# Patient Record
Sex: Male | Born: 1950 | Race: Black or African American | Hispanic: No | Marital: Married | Smoking: Former smoker
Health system: Southern US, Community
[De-identification: ages and names within clinical notes are randomized; demographics above are authoritative.]

## PROBLEM LIST (undated history)

## (undated) DIAGNOSIS — I10 Essential (primary) hypertension: Secondary | ICD-10-CM

## (undated) DIAGNOSIS — R0602 Shortness of breath: Secondary | ICD-10-CM

## (undated) DIAGNOSIS — E7800 Pure hypercholesterolemia, unspecified: Secondary | ICD-10-CM

## (undated) DIAGNOSIS — E119 Type 2 diabetes mellitus without complications: Secondary | ICD-10-CM

## (undated) HISTORY — PX: BACK SURGERY: SHX140

## (undated) HISTORY — PX: THYROIDECTOMY: SHX17

---

## 2002-02-24 ENCOUNTER — Ambulatory Visit (HOSPITAL_COMMUNITY): Admission: RE | Admit: 2002-02-24 | Discharge: 2002-02-24 | Payer: Self-pay | Admitting: Family Medicine

## 2002-02-24 ENCOUNTER — Encounter: Payer: Self-pay | Admitting: Family Medicine

## 2004-04-04 ENCOUNTER — Ambulatory Visit (HOSPITAL_COMMUNITY): Admission: RE | Admit: 2004-04-04 | Discharge: 2004-04-04 | Payer: Self-pay | Admitting: Family Medicine

## 2006-04-09 ENCOUNTER — Ambulatory Visit (HOSPITAL_COMMUNITY): Admission: RE | Admit: 2006-04-09 | Discharge: 2006-04-09 | Payer: Self-pay | Admitting: Gastroenterology

## 2006-04-09 ENCOUNTER — Ambulatory Visit: Payer: Self-pay | Admitting: Gastroenterology

## 2006-04-09 ENCOUNTER — Encounter (INDEPENDENT_AMBULATORY_CARE_PROVIDER_SITE_OTHER): Payer: Self-pay | Admitting: *Deleted

## 2009-06-09 ENCOUNTER — Ambulatory Visit (HOSPITAL_COMMUNITY): Admission: RE | Admit: 2009-06-09 | Discharge: 2009-06-09 | Payer: Self-pay | Admitting: Family Medicine

## 2009-07-07 ENCOUNTER — Emergency Department (HOSPITAL_COMMUNITY): Admission: EM | Admit: 2009-07-07 | Discharge: 2009-07-07 | Payer: Self-pay | Admitting: Emergency Medicine

## 2009-09-13 ENCOUNTER — Ambulatory Visit (HOSPITAL_COMMUNITY): Admission: RE | Admit: 2009-09-13 | Discharge: 2009-09-13 | Payer: Self-pay | Admitting: Family Medicine

## 2010-04-21 ENCOUNTER — Ambulatory Visit (HOSPITAL_COMMUNITY): Admission: RE | Admit: 2010-04-21 | Discharge: 2010-04-21 | Payer: Self-pay | Admitting: Family Medicine

## 2010-05-30 ENCOUNTER — Other Ambulatory Visit: Admission: RE | Admit: 2010-05-30 | Discharge: 2010-05-30 | Payer: Self-pay | Admitting: Urology

## 2010-09-12 ENCOUNTER — Ambulatory Visit (HOSPITAL_COMMUNITY)
Admission: RE | Admit: 2010-09-12 | Discharge: 2010-09-12 | Payer: Self-pay | Source: Home / Self Care | Attending: Family Medicine | Admitting: Family Medicine

## 2010-10-03 ENCOUNTER — Other Ambulatory Visit: Payer: Self-pay | Admitting: Urology

## 2010-10-03 ENCOUNTER — Encounter (HOSPITAL_COMMUNITY): Payer: 59

## 2010-10-03 DIAGNOSIS — Z01810 Encounter for preprocedural cardiovascular examination: Secondary | ICD-10-CM | POA: Insufficient documentation

## 2010-10-03 DIAGNOSIS — Z01812 Encounter for preprocedural laboratory examination: Secondary | ICD-10-CM | POA: Insufficient documentation

## 2010-10-03 LAB — BASIC METABOLIC PANEL
BUN: 13 mg/dL (ref 6–23)
CO2: 26 mEq/L (ref 19–32)
Calcium: 9.5 mg/dL (ref 8.4–10.5)
Creatinine, Ser: 1.1 mg/dL (ref 0.4–1.5)
GFR calc Af Amer: >60 mL/min (ref >60)

## 2010-10-03 LAB — SURGICAL PCR SCREEN
MRSA, PCR: NEGATIVE
Staphylococcus aureus: NEGATIVE

## 2010-10-03 LAB — HEMOGLOBIN AND HEMATOCRIT, BLOOD: HCT: 41.7 % (ref 39.0–52.0)

## 2010-10-04 ENCOUNTER — Ambulatory Visit (HOSPITAL_COMMUNITY)
Admission: RE | Admit: 2010-10-04 | Discharge: 2010-10-04 | Disposition: A | Discharge status: Home / Self Care | Payer: 59 | Source: Ambulatory Visit | Attending: Urology | Admitting: Urology

## 2010-10-04 DIAGNOSIS — Z01812 Encounter for preprocedural laboratory examination: Secondary | ICD-10-CM | POA: Insufficient documentation

## 2010-10-04 DIAGNOSIS — R972 Elevated prostate specific antigen [PSA]: Secondary | ICD-10-CM | POA: Insufficient documentation

## 2010-10-04 DIAGNOSIS — Z538 Procedure and treatment not carried out for other reasons: Secondary | ICD-10-CM | POA: Insufficient documentation

## 2010-10-04 DIAGNOSIS — Z01810 Encounter for preprocedural cardiovascular examination: Secondary | ICD-10-CM | POA: Insufficient documentation

## 2010-10-06 ENCOUNTER — Ambulatory Visit (HOSPITAL_COMMUNITY)
Admission: RE | Admit: 2010-10-06 | Discharge: 2010-10-06 | Disposition: A | Discharge status: Home / Self Care | Payer: 59 | Source: Ambulatory Visit | Attending: Urology | Admitting: Urology

## 2010-10-06 ENCOUNTER — Other Ambulatory Visit: Payer: Self-pay | Admitting: Urology

## 2010-10-06 DIAGNOSIS — Z79899 Other long term (current) drug therapy: Secondary | ICD-10-CM | POA: Insufficient documentation

## 2010-10-06 DIAGNOSIS — I10 Essential (primary) hypertension: Secondary | ICD-10-CM | POA: Insufficient documentation

## 2010-10-06 DIAGNOSIS — R972 Elevated prostate specific antigen [PSA]: Secondary | ICD-10-CM | POA: Insufficient documentation

## 2010-10-06 DIAGNOSIS — E785 Hyperlipidemia, unspecified: Secondary | ICD-10-CM | POA: Insufficient documentation

## 2010-10-06 DIAGNOSIS — E119 Type 2 diabetes mellitus without complications: Secondary | ICD-10-CM | POA: Insufficient documentation

## 2010-10-12 NOTE — Op Note (Signed)
  NAMEJUVENTINO, PAVONE               ACCOUNT NO.:  [id number]  MEDICAL RECORD NO.:  [id number]           PATIENT TYPE:  O  LOCATION:  DAYP                          FACILITY:  [facility]  PHYSICIAN:  Ky Barban, M.D.DATE OF BIRTH:  [birth year]  DATE OF PROCEDURE: DATE OF DISCHARGE:  10/06/2010                              OPERATIVE REPORT   PREOPERATIVE DIAGNOSIS:  Elevated PSA.  POSTOPERATIVE DIAGNOSIS:  Elevated PSA.  PROCEDURE:  Transrectal needle biopsy of the prostate.  ANESTHESIA:  IV MAC.  PROCEDURE:  The patient under IV sedation in lithotomy position as usual prep and drape.  After usual prep, digital rectal exam was done. Prostate was palpated, feels normal.  Then using a Tru-Cut biopsy needle several biopsies were done from both sides of the prostate.  No complications and the patient left the procedure room in satisfactory condition.     Ky Barban, M.D.     MIJ/MEDQ  D:  10/06/2010  T:  10/07/2010  Job:  [id number]  Electronically Signed by Alleen Borne M.D. on 10/12/2010 11:56:45 AM

## 2011-01-06 NOTE — Op Note (Signed)
Chris Wade, Chris Wade               ACCOUNT NO.:  [id number]   MEDICAL RECORD NO.:  [id number]          PATIENT TYPE:  AMB   LOCATION:  DAY                           FACILITY:  [facility]   PHYSICIAN:  Kassie Mends, M.D.      DATE OF BIRTH:  1950/09/13   DATE OF PROCEDURE:  04/09/2006  DATE OF DISCHARGE:                                 OPERATIVE REPORT   PROCEDURE:  Colonoscopy with cold forceps polypectomy and biopsy.   INDICATIONS FOR EXAMINATION:  Mr. Monrreal is a 60 year old male whose mother  had colon cancer in her 59s.  His last colonoscopy was 2000.   MEDICATIONS:  1. Demerol 75 mg IV.  2. Versed 3 mg IV.   FINDINGS:  1. Cecal prominence with pillow-like consistency and yellow hue.  Tunnel      biopsies obtained via cold forceps.  A proximal ascending colon fold      which was prominent had pillow-like consistency.  Tunnel biopsies      obtained via cold forceps.  2. A 4-mm ascending colon polyp removed via cold forceps.  3. Otherwise, no diverticula, masses, inflammatory changes or vascular      ectasia seen.  4. Normal retroflexed view of the rectum.   FINAL RECOMMENDATIONS:  1. No aspirin, NSAIDs or anticoagulation for 7 days.  2. Will follow up biopsies.  3. Colonoscopy in 5 years.  If polyps are adenomatous, his children should      begin colon cancer screening at age 79.  4. Follow with Dr. Renard Matter.   PROCEDURE TECHNIQUE:  Physical exam was performed, and informed consent was  obtained from the patient after explaining all the benefits, risks and  alternatives to the procedure.  The patient was connected to the monitor and  placed in left lateral position.  Continuous oxygen was provided by nasal  cannula and IV medicine administered through an indwelling cannula.  After  administration of sedation and rectal exam, the scope was advanced under  direct visualization to the cecum.  The scope was subsequently removed  slowly by carefully examining the color, texture,  anatomy and integrity of  mucosa on the way out.  The patient was recovered in the endoscopy suite and  discharged to the floor in satisfactory condition.      Kassie Mends, M.D.  Electronically Signed     SM/MEDQ  D:  04/09/2006  T:  04/09/2006  Job:  [id number]   cc:   Angus G. Renard Matter, MD  Fax: [phone]

## 2011-03-23 ENCOUNTER — Encounter: Payer: Self-pay | Admitting: Gastroenterology

## 2012-09-30 ENCOUNTER — Other Ambulatory Visit (HOSPITAL_COMMUNITY): Payer: Self-pay | Admitting: Family Medicine

## 2012-09-30 ENCOUNTER — Ambulatory Visit (HOSPITAL_COMMUNITY)
Admission: RE | Admit: 2012-09-30 | Discharge: 2012-09-30 | Disposition: A | Discharge status: Home / Self Care | Payer: 59 | Source: Ambulatory Visit | Attending: Family Medicine | Admitting: Family Medicine

## 2012-09-30 DIAGNOSIS — Z87891 Personal history of nicotine dependence: Secondary | ICD-10-CM

## 2012-09-30 DIAGNOSIS — Z0000 Encounter for general adult medical examination without abnormal findings: Secondary | ICD-10-CM | POA: Insufficient documentation

## 2013-12-15 ENCOUNTER — Ambulatory Visit (HOSPITAL_COMMUNITY)
Admission: RE | Admit: 2013-12-15 | Discharge: 2013-12-15 | Disposition: A | Discharge status: Home / Self Care | Payer: 59 | Source: Ambulatory Visit | Attending: Family Medicine | Admitting: Family Medicine

## 2013-12-15 ENCOUNTER — Other Ambulatory Visit (HOSPITAL_COMMUNITY): Payer: Self-pay | Admitting: Family Medicine

## 2013-12-15 DIAGNOSIS — R05 Cough: Secondary | ICD-10-CM | POA: Insufficient documentation

## 2013-12-15 DIAGNOSIS — F17200 Nicotine dependence, unspecified, uncomplicated: Secondary | ICD-10-CM | POA: Insufficient documentation

## 2013-12-15 DIAGNOSIS — Z87891 Personal history of nicotine dependence: Secondary | ICD-10-CM

## 2013-12-15 DIAGNOSIS — R059 Cough, unspecified: Secondary | ICD-10-CM | POA: Insufficient documentation

## 2014-01-13 ENCOUNTER — Other Ambulatory Visit: Payer: Self-pay

## 2014-01-13 ENCOUNTER — Encounter (HOSPITAL_COMMUNITY)
Admission: RE | Admit: 2014-01-13 | Discharge: 2014-01-13 | Disposition: A | Discharge status: Home / Self Care | Payer: 59 | Source: Ambulatory Visit | Attending: Urology | Admitting: Urology

## 2014-01-13 ENCOUNTER — Encounter (HOSPITAL_COMMUNITY): Payer: Self-pay

## 2014-01-13 ENCOUNTER — Encounter (HOSPITAL_COMMUNITY): Payer: Self-pay | Admitting: Pharmacy Technician

## 2014-01-13 DIAGNOSIS — Z01810 Encounter for preprocedural cardiovascular examination: Secondary | ICD-10-CM | POA: Insufficient documentation

## 2014-01-13 DIAGNOSIS — Z01812 Encounter for preprocedural laboratory examination: Secondary | ICD-10-CM | POA: Insufficient documentation

## 2014-01-13 HISTORY — DX: Shortness of breath: R06.02

## 2014-01-13 HISTORY — DX: Type 2 diabetes mellitus without complications: E11.9

## 2014-01-13 HISTORY — DX: Pure hypercholesterolemia, unspecified: E78.00

## 2014-01-13 HISTORY — DX: Essential (primary) hypertension: I10

## 2014-01-13 LAB — BASIC METABOLIC PANEL
BUN: 16 mg/dL (ref 6–23)
CALCIUM: 9.8 mg/dL (ref 8.4–10.5)
CO2: 26 meq/L (ref 19–32)
Chloride: 102 mEq/L (ref 96–112)
Creatinine, Ser: 0.97 mg/dL (ref 0.50–1.35)
GFR calc Af Amer: >90 mL/min (ref >90)
GFR, EST NON AFRICAN AMERICAN: 88 mL/min — AB (ref >90)
Glucose, Bld: 213 mg/dL — ABNORMAL HIGH (ref 70–99)
POTASSIUM: 4.2 meq/L (ref 3.7–5.3)
SODIUM: 142 meq/L (ref 137–147)

## 2014-01-13 LAB — HEMOGLOBIN AND HEMATOCRIT, BLOOD
HEMATOCRIT: 42.1 % (ref 39.0–52.0)
HEMOGLOBIN: 13.9 g/dL (ref 13.0–17.0)

## 2014-01-13 NOTE — Progress Notes (Signed)
01/13/14 1356  OBSTRUCTIVE SLEEP APNEA  Have you ever been diagnosed with sleep apnea through a sleep study? No  Do you snore loudly (loud enough to be heard through closed doors)?  1  Do you often feel tired, fatigued, or sleepy during the daytime? 0  Has anyone observed you stop breathing during your sleep? 0  Do you have, or are you being treated for high blood pressure? 1  BMI more than 35 kg/m2? 0  Age over 61 years old? 1  Neck circumference greater than 40 cm/16 inches? 0  Gender: 1  Obstructive Sleep Apnea Score 4  Score 4 or greater  Results sent to PCP

## 2014-01-13 NOTE — Patient Instructions (Addendum)
Chris Wade  01/13/2014   Your procedure is scheduled on:   01/15/2014  Report to [facility] at  830 AM.  Call this number if you have problems the morning of surgery: [phone]   Remember:   Do not eat food or drink liquids after midnight.   Take these medicines the morning of surgery with A SIP OF WATER: blood pressure pill. No medicines for diabetes the morning of surgery.   Do not wear jewelry, make-up or nail polish.  Do not wear lotions, powders, or perfumes.   Do not shave 48 hours prior to surgery. Men may shave face and neck.  Do not bring valuables to the hospital.  [facility] is not responsible for any belongings or valuables.               Contacts, dentures or bridgework may not be worn into surgery.  Leave suitcase in the car. After surgery it may be brought to your room.  For patients admitted to the hospital, discharge time is determined by your  treatment team.               Patients discharged the day of surgery will not be allowed to drive home.  Name and phone number of your driver: family  Special Instructions: Shower using CHG 2 nights before surgery and the night before surgery.  If you shower the day of surgery use CHG.  Use special wash - you have one bottle of CHG for all showers.  You should use approximately 1/3 of the bottle for each shower.   Please read over the following fact sheets that you were given: Pain Booklet, Coughing and Deep Breathing, Surgical Site Infection Prevention, Anesthesia Post-op Instructions and Care and Recovery After Surgery Biopsy Care After Refer to this sheet in the next few weeks. These instructions provide you with information on caring for yourself after your procedure. Your caregiver may also give you more specific instructions. Your treatment has been planned according to current medical practices, but problems sometimes occur. Call your caregiver if you have any problems or questions after your procedure. If  you had a fine needle biopsy, you may have soreness at the biopsy site for 1 to 2 days. If you had an open biopsy, you may have soreness at the biopsy site for 3 to 4 days. HOME CARE INSTRUCTIONS   You may resume normal diet and activities as directed.  Change bandages (dressings) as directed. If your wound was closed with a skin glue (adhesive), it will wear off and begin to peel in 7 days.  Only take over-the-counter or prescription medicines for pain, discomfort, or fever as directed by your caregiver.  Ask your caregiver when you can bathe and get your wound wet. SEEK IMMEDIATE MEDICAL CARE IF:   You have increased bleeding (more than a small spot) from the biopsy site.  You notice redness, swelling, or increasing pain at the biopsy site.  You have pus coming from the biopsy site.  You have a fever.  You notice a bad smell coming from the biopsy site or dressing.  You have a rash, have difficulty breathing, or have any allergic problems. MAKE SURE YOU:   Understand these instructions.  Will watch your condition.  Will get help right away if you are not doing well or get worse. Document Released: 02/24/2005 Document Revised: 10/30/2011 Document Reviewed: 02/02/2011 [facility] Patient Information 2014 [employer] [place]. PATIENT INSTRUCTIONS POST-ANESTHESIA  IMMEDIATELY FOLLOWING  SURGERY:  Do not drive or operate machinery for the first twenty four hours after surgery.  Do not make any important decisions for twenty four hours after surgery or while taking narcotic pain medications or sedatives.  If you develop intractable nausea and vomiting or a severe headache please notify your doctor immediately.  FOLLOW-UP:  Please make an appointment with your surgeon as instructed. You do not need to follow up with anesthesia unless specifically instructed to do so.  WOUND CARE INSTRUCTIONS (if applicable):  Keep a dry clean dressing on the anesthesia/puncture wound site if there is  drainage.  Once the wound has quit draining you may leave it open to air.  Generally you should leave the bandage intact for twenty four hours unless there is drainage.  If the epidural site drains for more than 36-48 hours please call the anesthesia department.  QUESTIONS?:  Please feel free to call your physician or the hospital operator if you have any questions, and they will be happy to assist you.

## 2014-01-13 NOTE — Pre-Procedure Instructions (Signed)
Patient given information to sign up for my chart at home. 

## 2014-01-13 NOTE — Pre-Procedure Instructions (Signed)
Patient in for PAT and was instructed by office to stop ASA last Friday and patient "forgot". Has taken ASA througt this am. Spoke with Dr Lawerance Cruel office and they are going to reschedule procedure since ASA was not stopped. Preop done today. Instructed patient and wife we will call when rescheduled with time and both verbalized understanding. Both verbalized understanding of patient stopping ASA today.

## 2014-01-15 ENCOUNTER — Encounter (HOSPITAL_COMMUNITY): Admission: RE | Admit: 2014-01-15 | Payer: 59 | Source: Ambulatory Visit

## 2014-01-15 NOTE — H&P (Signed)
Urology History and Physical Exam  CC: Elevated PSA  HPI: 63 y.o. year old male who has elevated PSA, no urinary symptoms. He had one biopsy done in 2013 which was negative. At that Time, his PSA was 6.4. Now his PSA has gone up to 18.33. That is why I am recommending that we go ahead and do  Another biopsy. His prostate feels normal on digital rectal exam.  PMH: Past Medical History  Diagnosis Date  . Hypertension   . Hypercholesteremia   . Diabetes mellitus without complication   . Shortness of breath     PSH: Past Surgical History  Procedure Laterality Date  . Thyroidectomy    . Back surgery      ruptured disc    Allergies: No Known Allergies  Medications: No prescriptions prior to admission     Social History: History   Social History  . Marital Status: Married    Spouse Name: N/A    Number of Children: N/A  . Years of Education: N/A   Occupational History  . Not on file.   Social History Main Topics  . Smoking status: Former Smoker -- 1.00 packs/day for 30 years    Types: Cigarettes    Quit date: 01/14/2004  . Smokeless tobacco: Not on file  . Alcohol Use: No  . Drug Use: No  . Sexual Activity: Yes    Birth Control/ Protection: None   Other Topics Concern  . Not on file   Social History Narrative  . No narrative on file    Family History: No family history on file.  Review of Systems: Positive:  Negative: .  A further 10 point review of systems was negative except what is listed in  the HPI.  Physical Exam: @VITALS2 @ General: No acute distress.  Awake. Head:  Normocephalic.  Atraumatic. ENT:  EOMI.  Mucous membranes moist Neck:  Supple.  No lymphadenopathy. CV:  S1 present. S2 present. Regular rate. Pulmonary: Equal effort bilaterally.  Clear to auscultation bilaterally. Abdomen: Soft, non distended, no tenderness. Skin:  Normal turgor.  No visible rash. Extremity: No gross deformity of bilateral upper extremities.  No gross  deformity of                             lower extremities. Neurologic: Alert. Appropriate mood.  Penis:  circumcised.  No lesions. Urethra: Orthotopic meatus. Scrotum: No lesions.  No ecchymosis.  No erythema. Testicles: Descended bilaterally.  No masses bilaterally. Epididymis: Palpable bilaterally. Nontender to palpation.  Studies:  Recent Labs     01/13/14  1400  HGB  13.9    Recent Labs     01/13/14  1400  NA  142  K  4.2  CL  102  CO2  26  BUN  16  CREATININE  0.97  CALCIUM  9.8  GFRNONAA  88*  GFRAA  >90     No results found for this basename: PT, INR, APTT,  in the last 72 hours   No components found with this basename: ABG,      Assessment:  Elevated PSA..One negative prostate biopsy in 2013. Further elevation of PSA.    Plan:  Prostate biopsy under anesthesia as requested by the patient.   Ky Barban  01/15/2014 1:37 PM

## 2014-01-20 ENCOUNTER — Ambulatory Visit (HOSPITAL_COMMUNITY): Payer: 59 | Admitting: Anesthesiology

## 2014-01-20 ENCOUNTER — Encounter (HOSPITAL_COMMUNITY)
Admission: RE | Disposition: A | Discharge status: Home / Self Care | Payer: Self-pay | Source: Ambulatory Visit | Attending: Urology

## 2014-01-20 ENCOUNTER — Ambulatory Visit (HOSPITAL_COMMUNITY)
Admission: RE | Admit: 2014-01-20 | Discharge: 2014-01-20 | Disposition: A | Discharge status: Home / Self Care | Payer: 59 | Source: Ambulatory Visit | Attending: Urology | Admitting: Urology

## 2014-01-20 ENCOUNTER — Ambulatory Visit: Admit: 2014-01-20 | Payer: 59

## 2014-01-20 ENCOUNTER — Other Ambulatory Visit (HOSPITAL_COMMUNITY): Payer: Self-pay | Admitting: Urology

## 2014-01-20 ENCOUNTER — Ambulatory Visit (HOSPITAL_COMMUNITY): Payer: 59

## 2014-01-20 ENCOUNTER — Encounter (HOSPITAL_COMMUNITY): Payer: Self-pay | Admitting: Anesthesiology

## 2014-01-20 ENCOUNTER — Encounter (HOSPITAL_COMMUNITY): Payer: 59 | Admitting: Anesthesiology

## 2014-01-20 DIAGNOSIS — R972 Elevated prostate specific antigen [PSA]: Secondary | ICD-10-CM

## 2014-01-20 DIAGNOSIS — E119 Type 2 diabetes mellitus without complications: Secondary | ICD-10-CM | POA: Insufficient documentation

## 2014-01-20 DIAGNOSIS — I10 Essential (primary) hypertension: Secondary | ICD-10-CM | POA: Insufficient documentation

## 2014-01-20 DIAGNOSIS — Z87891 Personal history of nicotine dependence: Secondary | ICD-10-CM | POA: Insufficient documentation

## 2014-01-20 HISTORY — PX: PROSTATE BIOPSY: SHX241

## 2014-01-20 LAB — GLUCOSE, CAPILLARY
GLUCOSE-CAPILLARY: 172 mg/dL — AB (ref 70–99)
Glucose-Capillary: 157 mg/dL — ABNORMAL HIGH (ref 70–99)

## 2014-01-20 SURGERY — BIOPSY, PROSTATE
Anesthesia: Monitor Anesthesia Care | Site: Prostate

## 2014-01-20 MED ORDER — LACTATED RINGERS IV SOLN
INTRAVENOUS | Status: DC
  Administered 2014-01-20: 09:00:00 via INTRAVENOUS

## 2014-01-20 MED ORDER — SODIUM CHLORIDE BACTERIOSTATIC 0.9 % IJ SOLN
INTRAMUSCULAR | Status: AC
  Filled 2014-01-20: qty 10

## 2014-01-20 MED ORDER — FENTANYL CITRATE 0.05 MG/ML IJ SOLN: 25.0000 ug | INTRAMUSCULAR | Status: DC | PRN

## 2014-01-20 MED ORDER — FENTANYL CITRATE 0.05 MG/ML IJ SOLN
25.0000 ug | INTRAMUSCULAR | Status: AC
  Administered 2014-01-20 (×2): 25 ug via INTRAVENOUS

## 2014-01-20 MED ORDER — ONDANSETRON HCL 4 MG/2ML IJ SOLN
INTRAMUSCULAR | Status: AC
  Filled 2014-01-20: qty 2

## 2014-01-20 MED ORDER — LACTATED RINGERS IV SOLN
INTRAVENOUS | Status: DC | PRN
  Administered 2014-01-20: 08:00:00 via INTRAVENOUS

## 2014-01-20 MED ORDER — GLYCOPYRROLATE 0.2 MG/ML IJ SOLN
INTRAMUSCULAR | Status: AC
  Filled 2014-01-20: qty 1

## 2014-01-20 MED ORDER — SODIUM CHLORIDE BACTERIOSTATIC 0.9 % IJ SOLN
INTRAMUSCULAR | Status: AC
  Filled 2014-01-20: qty 30

## 2014-01-20 MED ORDER — MIDAZOLAM HCL 2 MG/2ML IJ SOLN
INTRAMUSCULAR | Status: AC
  Filled 2014-01-20: qty 2

## 2014-01-20 MED ORDER — MIDAZOLAM HCL 2 MG/2ML IJ SOLN
1.0000 mg | INTRAMUSCULAR | Status: DC | PRN
  Administered 2014-01-20: 2 mg via INTRAVENOUS

## 2014-01-20 MED ORDER — LIDOCAINE HCL (PF) 1 % IJ SOLN
INTRAMUSCULAR | Status: AC
  Filled 2014-01-20: qty 5

## 2014-01-20 MED ORDER — ONDANSETRON HCL 4 MG/2ML IJ SOLN
4.0000 mg | Freq: Once | INTRAMUSCULAR | Status: AC
  Administered 2014-01-20: 4 mg via INTRAVENOUS

## 2014-01-20 MED ORDER — PROPOFOL 10 MG/ML IV BOLUS
INTRAVENOUS | Status: AC
  Filled 2014-01-20: qty 20

## 2014-01-20 MED ORDER — FENTANYL CITRATE 0.05 MG/ML IJ SOLN
INTRAMUSCULAR | Status: DC | PRN
  Administered 2014-01-20 (×2): 50 ug via INTRAVENOUS

## 2014-01-20 MED ORDER — SURGILUBE EX GEL
CUTANEOUS | Status: DC | PRN
  Administered 2014-01-20: 1 via TOPICAL

## 2014-01-20 MED ORDER — FENTANYL CITRATE 0.05 MG/ML IJ SOLN
INTRAMUSCULAR | Status: AC
  Filled 2014-01-20: qty 2

## 2014-01-20 MED ORDER — FENTANYL CITRATE 0.05 MG/ML IJ SOLN
INTRAMUSCULAR | Status: AC
Start: 2014-01-20 — End: 2014-01-20
  Filled 2014-01-20: qty 5

## 2014-01-20 MED ORDER — EPHEDRINE SULFATE 50 MG/ML IJ SOLN
INTRAMUSCULAR | Status: AC
  Filled 2014-01-20: qty 1

## 2014-01-20 MED ORDER — ONDANSETRON HCL 4 MG/2ML IJ SOLN: 4.0000 mg | Freq: Once | INTRAMUSCULAR | Status: DC | PRN

## 2014-01-20 MED ORDER — PROPOFOL INFUSION 10 MG/ML OPTIME
INTRAVENOUS | Status: DC | PRN
  Administered 2014-01-20: 75 ug/kg/min via INTRAVENOUS
  Administered 2014-01-20: 10:00:00 via INTRAVENOUS

## 2014-01-20 MED ORDER — CIPROFLOXACIN IN D5W 400 MG/200ML IV SOLN
INTRAVENOUS | Status: AC
  Filled 2014-01-20: qty 200

## 2014-01-20 MED ORDER — GLYCOPYRROLATE 0.2 MG/ML IJ SOLN
0.2000 mg | Freq: Once | INTRAMUSCULAR | Status: AC
  Administered 2014-01-20: 0.2 mg via INTRAVENOUS

## 2014-01-20 MED ORDER — LIDOCAINE HCL (CARDIAC) 10 MG/ML IV SOLN
INTRAVENOUS | Status: DC | PRN
  Administered 2014-01-20: 50 mg via INTRAVENOUS

## 2014-01-20 MED ORDER — CIPROFLOXACIN IN D5W 400 MG/200ML IV SOLN
400.0000 mg | Freq: Once | INTRAVENOUS | Status: AC
  Administered 2014-01-20: 400 mg via INTRAVENOUS

## 2014-01-20 MED ORDER — PHENYLEPHRINE HCL 10 MG/ML IJ SOLN
INTRAMUSCULAR | Status: AC
  Filled 2014-01-20: qty 1

## 2014-01-20 MED ORDER — SUCCINYLCHOLINE CHLORIDE 20 MG/ML IJ SOLN
INTRAMUSCULAR | Status: AC
  Filled 2014-01-20: qty 1

## 2014-01-20 SURGICAL SUPPLY — 21 items
CLOTH BEACON ORANGE TIMEOUT ST (SAFETY) ×3 IMPLANT
COVER PROBE W GEL 5X96 (DRAPES) ×2 IMPLANT
COVER TABLE BACK 60X90 (DRAPES) ×3 IMPLANT
DRAPE PROXIMA HALF (DRAPES) ×3 IMPLANT
FORMALIN 10 PREFIL 20ML (MISCELLANEOUS) ×28 IMPLANT
GAUZE SPONGE 4X4 12PLY STRL (GAUZE/BANDAGES/DRESSINGS) ×3 IMPLANT
GLOVE BIO SURGEON STRL SZ7 (GLOVE) ×6 IMPLANT
GLOVE BIOGEL PI IND STRL 7.0 (GLOVE) IMPLANT
GLOVE BIOGEL PI INDICATOR 7.0 (GLOVE) ×4
GOWN STRL REUS W/TWL LRG LVL3 (GOWN DISPOSABLE) ×3 IMPLANT
KIT ROOM TURNOVER AP CYSTO (KITS) ×5 IMPLANT
LUBRICANT JELLY 4.5OZ STERILE (MISCELLANEOUS) ×3 IMPLANT
MANIFOLD NEPTUNE II (INSTRUMENTS) ×3 IMPLANT
MARKER SKIN DUAL TIP RULER LAB (MISCELLANEOUS) ×3 IMPLANT
NDL BIOPSY 18X20 MAGNUM (NEEDLE) ×1 IMPLANT
NEEDLE BIOPSY 18X20 MAGNUM (NEEDLE) ×3 IMPLANT
NS IRRIG 1000ML POUR BTL (IV SOLUTION) ×3 IMPLANT
PAD ARMBOARD 7.5X6 YLW CONV (MISCELLANEOUS) ×3 IMPLANT
PAD TELFA 3X4 1S STER (GAUZE/BANDAGES/DRESSINGS) ×5 IMPLANT
TOWEL OR 17X26 4PK STRL BLUE (TOWEL DISPOSABLE) ×3 IMPLANT
WATER STERILE IRR 1000ML POUR (IV SOLUTION) ×3 IMPLANT

## 2014-01-20 NOTE — Transfer of Care (Signed)
Immediate Anesthesia Transfer of Care Note  Patient: Chris Wade  Procedure(s) Performed: Procedure(s): PROSTATE BIOPSY (N/A)  Patient Location: PACU  Anesthesia Type:MAC  Level of Consciousness: awake, alert , oriented and patient cooperative  Airway & Oxygen Therapy: Patient Spontanous Breathing and Patient connected to nasal cannula oxygen  Post-op Assessment: Report given to PACU RN and Post -op Vital signs reviewed and stable  Post vital signs: Reviewed and stable  Complications: No apparent anesthesia complications

## 2014-01-20 NOTE — Anesthesia Preprocedure Evaluation (Signed)
Anesthesia Evaluation  Patient identified by MRN, date of birth, ID band Patient awake    Reviewed: Allergy & Precautions, H&P , NPO status , Patient's Chart, lab work & pertinent test results  Airway Mallampati: I TM Distance: >3 FB     Dental  (+) Poor Dentition, Loose, Missing   Pulmonary shortness of breath, former smoker (am cough),  breath sounds clear to auscultation        Cardiovascular hypertension, Pt. on medications Rhythm:Regular Rate:Normal     Neuro/Psych    GI/Hepatic   Endo/Other  diabetes, Type 2, Insulin Dependent, Oral Hypoglycemic Agents  Renal/GU      Musculoskeletal   Abdominal   Peds  Hematology   Anesthesia Other Findings   Reproductive/Obstetrics                           Anesthesia Physical Anesthesia Plan  ASA: III  Anesthesia Plan: MAC   Post-op Pain Management:    Induction: Intravenous  Airway Management Planned: Simple Face Mask  Additional Equipment:   Intra-op Plan:   Post-operative Plan:   Informed Consent: I have reviewed the patients History and Physical, chart, labs and discussed the procedure including the risks, benefits and alternatives for the proposed anesthesia with the patient or authorized representative who has indicated his/her understanding and acceptance.     Plan Discussed with:   Anesthesia Plan Comments:         Anesthesia Quick Evaluation

## 2014-01-20 NOTE — Brief Op Note (Signed)
01/20/2014  10:07 AM  PATIENT:  Chris Wade  63 y.o. male  PRE-OPERATIVE DIAGNOSIS:  elevated prostatic specific antigen  POST-OPERATIVE DIAGNOSIS:  elevated prostatic specific antigen  PROCEDURE:  Procedure(s): PROSTATE BIOPSY (N/A)  SURGEON:  Surgeon(s) and Role:    * Ky Barban, MD - Primary  PHYSICIAN ASSISTANT:   ASSISTANTS: none   ANESTHESIA:   MAC  EBL:  Total I/O In: 400 [I.V.:400] Out: -   BLOOD ADMINISTERED:none  DRAINS: none   LOCAL MEDICATIONS USED:  NONE  SPECIMEN:  Source of Specimen:  prostate biopsies  DISPOSITION OF SPECIMEN:  PATHOLOGY  COUNTS:  YES  TOURNIQUET:  * No tourniquets in log *  DICTATION: .Other Dictation: Dictation Number dictation [id number]  PLAN OF CARE: Discharge to home after PACU  PATIENT DISPOSITION:  PACU - hemodynamically stable.   Delay start of Pharmacological VTE agent (>24hrs) due to surgical blood loss or risk of bleeding:

## 2014-01-20 NOTE — Discharge Instructions (Signed)
Call if fever.bleeding.

## 2014-01-20 NOTE — Anesthesia Postprocedure Evaluation (Signed)
  Anesthesia Post-op Note  Patient: Chris Wade  Procedure(s) Performed: Procedure(s): PROSTATE BIOPSY (N/A)  Patient Location: PACU  Anesthesia Type:MAC  Level of Consciousness: awake, alert , oriented and patient cooperative  Airway and Oxygen Therapy: Patient Spontanous Breathing and Patient connected to nasal cannula oxygen  Post-op Pain: none  Post-op Assessment: Post-op Vital signs reviewed, Patient's Cardiovascular Status Stable, Respiratory Function Stable, Patent Airway, No signs of Nausea or vomiting and Pain level controlled  Post-op Vital Signs: Reviewed and stable  Last Vitals:  Filed Vitals:   01/20/14 1012  BP: 124/82  Pulse: 75  Temp: 36.5 C  Resp: 16    Complications: No apparent anesthesia complications

## 2014-01-20 NOTE — Progress Notes (Signed)
No change in H&P on reexamination. 

## 2014-01-20 NOTE — Anesthesia Procedure Notes (Signed)
Procedure Name: MAC Date/Time: 01/20/2014 9:13 AM Performed by: Pernell Dupre, AMY A Pre-anesthesia Checklist: Patient identified, Timeout performed, Emergency Drugs available, Suction available and Patient being monitored Oxygen Delivery Method: Simple face mask

## 2014-01-21 ENCOUNTER — Encounter (HOSPITAL_COMMUNITY): Payer: Self-pay | Admitting: Urology

## 2014-01-21 NOTE — Op Note (Deleted)
NAMEDOEL, HEITING NO.:  [id number]  MEDICAL RECORD NO.:  [id number]  LOCATION:                                 FACILITY:  PHYSICIAN:  Chris Wade, M.D.DATE OF BIRTH:  [birth year]  DATE OF PROCEDURE: DATE OF DISCHARGE:  01/20/2014                              OPERATIVE REPORT   PREOPERATIVE DIAGNOSIS:  Elevated PSA.  PROCEDURE:  Transrectal ultrasound with multiple needle biopsies of the prostate.  ANESTHESIA:  MAC.  DESCRIPTION OF PROCEDURE:  The patient under MAC anesthesia, in lithotomy position.  After usual prep and drape, I tried to do transrectally without the ultrasound.  I could hardly feel the prostate and I was able to take three biopsies from the left side.  Then, I used ultrasound probe, which was introduced.  The prostate was visualized very clearly on it and peripheral zone on the right.  Six biopsies were taken from the superior middle and inferior part of the prostate. Similarly, the left prostate was biopsied.  Superior middle and inferior part, six biopsies were taken on the left side.  After taking the biopsies, everything went fine and probe was removed.  The patient left the operating room in satisfactory condition.     Chris Wade, M.D.     MIJ/MEDQ  D:  01/20/2014  T:  01/21/2014  Job:  [id number]

## 2014-01-21 NOTE — Op Note (Signed)
NAME:  Ashenfelter, Izell               ACCOUNT NO.:  [id number]  MEDICAL RECORD NO.:  [id number]  LOCATION:                                 FACILITY:  PHYSICIAN:  Mohammad I. Randell Detter, M.D.DATE OF BIRTH:  11/17/1950  DATE OF PROCEDURE: DATE OF DISCHARGE:  01/20/2014                              OPERATIVE REPORT   PREOPERATIVE DIAGNOSIS:  Elevated PSA.  PROCEDURE:  Transrectal ultrasound with multiple needle biopsies of the prostate.  ANESTHESIA:  MAC.  DESCRIPTION OF PROCEDURE:  The patient under MAC anesthesia, in lithotomy position.  After usual prep and drape, I tried to do transrectally without the ultrasound.  I could hardly feel the prostate and I was able to take three biopsies from the left side.  Then, I used ultrasound probe, which was introduced.  The prostate was visualized very clearly on it and peripheral zone on the right.  Six biopsies were taken from the superior middle and inferior part of the prostate. Similarly, the left prostate was biopsied.  Superior middle and inferior part, six biopsies were taken on the left side.  After taking the biopsies, everything went fine and probe was removed.  The patient left the operating room in satisfactory condition.     Mohammad I. Eban Weick, M.D.     MIJ/MEDQ  D:  01/20/2014  T:  01/21/2014  Job:  [id number] 

## 2014-02-03 ENCOUNTER — Ambulatory Visit (HOSPITAL_COMMUNITY)
Admission: RE | Admit: 2014-02-03 | Discharge: 2014-02-03 | Disposition: A | Discharge status: Home / Self Care | Payer: 59 | Source: Ambulatory Visit | Attending: Family Medicine | Admitting: Family Medicine

## 2014-02-03 ENCOUNTER — Other Ambulatory Visit (HOSPITAL_COMMUNITY): Payer: Self-pay | Admitting: Family Medicine

## 2014-02-03 DIAGNOSIS — M7989 Other specified soft tissue disorders: Secondary | ICD-10-CM | POA: Insufficient documentation

## 2014-02-03 DIAGNOSIS — M25569 Pain in unspecified knee: Secondary | ICD-10-CM

## 2014-02-24 ENCOUNTER — Other Ambulatory Visit (HOSPITAL_COMMUNITY): Payer: Self-pay | Admitting: Family Medicine

## 2014-02-24 DIAGNOSIS — M25561 Pain in right knee: Secondary | ICD-10-CM

## 2014-03-02 ENCOUNTER — Ambulatory Visit (HOSPITAL_COMMUNITY): Payer: 59

## 2014-06-11 ENCOUNTER — Encounter: Payer: Self-pay | Admitting: Orthopedic Surgery

## 2014-06-11 ENCOUNTER — Ambulatory Visit (INDEPENDENT_AMBULATORY_CARE_PROVIDER_SITE_OTHER): Payer: 59 | Admitting: Orthopedic Surgery

## 2014-06-11 VITALS — BP 137/77 | Ht 69.0 in | Wt 229.4 lb

## 2014-06-11 DIAGNOSIS — M1711 Unilateral primary osteoarthritis, right knee: Secondary | ICD-10-CM | POA: Insufficient documentation

## 2014-06-11 MED ORDER — DICLOFENAC POTASSIUM 50 MG PO TABS: 50.0000 mg | ORAL_TABLET | Freq: Two times a day (BID) | ORAL | Status: DC

## 2014-06-11 MED ORDER — TRAMADOL-ACETAMINOPHEN 37.5-325 MG PO TABS: 1.0000 | ORAL_TABLET | ORAL | Status: DC | PRN

## 2014-06-11 NOTE — Progress Notes (Signed)
Patient ID: Chris Wade Marcy, male   DOB: 10-10-1950, 63 y.o.   MRN: [id number]  Chief Complaint  Patient presents with  . Knee Pain    Right knee pain, referred by Mcinnis    HPI Chris Wade Tidd is a 63 y.o. male.  Knee Pain     Dr. 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 His pain is partially was resolved with 3 injections over the last year but only temporarily. He did also take some ibuprofen. Presents for further evaluation and treatment  Past Medical History  Diagnosis Date  . Hypertension   . Hypercholesteremia   . Diabetes mellitus without complication   . Shortness of breath     Past Surgical History  Procedure Laterality Date  . Thyroidectomy    . Back surgery      ruptured disc  . Prostate biopsy N/A 01/20/2014    Procedure: PROSTATE BIOPSY;  Surgeon: Ky BarbanMohammad I Javaid, MD;  Location: [facility];  Service: Urology;  Laterality: N/A;    No family history on file.  Social History History  Substance Use Topics  . Smoking status: Former Smoker -- 1.00 packs/day for 30 years    Types: Cigarettes    Quit date: 01/14/2004  . Smokeless tobacco: Not on file  . Alcohol Use: No    No Known Allergies  Current Outpatient Prescriptions  Medication Sig Dispense Refill  . alfuzosin (UROXATRAL) 10 MG 24 hr tablet Take 10 mg by mouth daily with breakfast.      . atorvastatin (LIPITOR) 10 MG tablet Take 10 mg by mouth daily.      . Dapagliflozin Propanediol (FARXIGA) 10 MG TABS Take 1 tablet by mouth daily.      . insulin aspart (NOVOLOG) 100 UNIT/ML injection Inject 8-12 Units into the skin 3 (three) times daily before meals. SLIDING SCALE      . insulin glargine (LANTUS) 100 UNIT/ML injection Inject 50 Units into the skin 2 (two) times daily.      . irbesartan (AVAPRO) 150 MG tablet Take 150 mg  by mouth daily.      . diclofenac (CATAFLAM) 50 MG tablet Take 1 tablet (50 mg total) by mouth 2 (two) times daily.  60 tablet  3  . traMADol-acetaminophen (ULTRACET) 37.5-325 MG per tablet Take 1 tablet by mouth every 4 (four) hours as needed.  60 tablet  3   No current facility-administered medications for this visit.    Review of Systems Review of Systems  Neurological:       Burning pain in both legs  All other systems reviewed and are negative.   Blood pressure 137/77, height 5\' 9"  (1.753 m), weight 229 lb 6.4 oz (104.055 kg).  Physical Exam Physical Exam  Vitals reviewed. Constitutional: He is oriented to person, place, and time. He appears well-developed and well-nourished. No distress.  Cardiovascular: Intact distal pulses.   Lymphadenopathy:       Right: No inguinal adenopathy present.       Left: No inguinal adenopathy present.  Neurological: He is alert and oriented to person, place, and time. He has normal strength. No sensory deficit. He exhibits normal muscle tone. Gait normal.  Skin: Skin is warm and dry. No rash noted. No erythema. No pallor.  Psychiatric: He has a normal mood and affect. His behavior is normal.  Judgment and thought content normal.   left knee normal range of motion stability and strength no tenderness or swelling   Right knee medial joint line tenderness effusion varus alignment negative McMurray's. Ligament stable. Knee flexion ARC 125.    Data Reviewed X-ray at the hospital my interpretation of the x-rays that he has 3 compartment arthritis bone to bone but working its way they are  Assessment    Osteoarthritis of the right knee    Plan    Diclofenac 50 mg twice a Ultracet one every 4 when necessary pain  Followup 6 weeks repeat injection if needed       Fuller CanadaStanley Harrison 06/11/2014, 11:01 AM

## 2014-06-25 ENCOUNTER — Other Ambulatory Visit (HOSPITAL_COMMUNITY): Payer: Self-pay | Admitting: Urology

## 2014-06-25 DIAGNOSIS — R972 Elevated prostate specific antigen [PSA]: Secondary | ICD-10-CM

## 2014-07-13 ENCOUNTER — Ambulatory Visit (HOSPITAL_COMMUNITY)
Admission: RE | Admit: 2014-07-13 | Discharge: 2014-07-13 | Disposition: A | Discharge status: Home / Self Care | Payer: 59 | Source: Ambulatory Visit | Attending: Urology | Admitting: Urology

## 2014-07-13 DIAGNOSIS — R972 Elevated prostate specific antigen [PSA]: Secondary | ICD-10-CM

## 2014-07-13 MED ORDER — GADOBENATE DIMEGLUMINE 529 MG/ML IV SOLN
20.0000 mL | Freq: Once | INTRAVENOUS | Status: AC | PRN
  Administered 2014-07-13: 20 mL via INTRAVENOUS

## 2014-07-23 ENCOUNTER — Encounter: Payer: Self-pay | Admitting: Orthopedic Surgery

## 2014-07-23 ENCOUNTER — Ambulatory Visit (INDEPENDENT_AMBULATORY_CARE_PROVIDER_SITE_OTHER): Payer: 59 | Admitting: Orthopedic Surgery

## 2014-07-23 VITALS — BP 136/87 | Ht 69.0 in | Wt 229.4 lb

## 2014-07-23 DIAGNOSIS — M1711 Unilateral primary osteoarthritis, right knee: Secondary | ICD-10-CM

## 2014-07-23 NOTE — Progress Notes (Signed)
Patient ID: Chris Wade, male   DOB: 1951-04-26, 63 y.o.   MRN: [id number] Chief Complaint  Patient presents with  . Follow-up    follow up right knee s/p medication    History 63 year old male with multiple knee injections presents after treatment with Ultram/Tylenol, diclofenac for osteoarthritis of the right knee.  Response to treatment: Very good response to treatment he gets an occasional pain over the medial joint line but very tolerable.   Review of systems No health issues at this time    Physical exam  BP 136/87 mmHg  Ht 5\' 9"  (1.753 m)  Wt 229 lb 6.4 oz (104.055 kg)  BMI 33.86 kg/m2  He is ambulatory without assistive devices today he has full range of motion in his knee with no swelling or if he can't tenderness. He is awake alert and oriented 3 mood and affect are normal Neurovascular exam intact  Stable arthritis  Continue diclofenac and Ultracet  Follow-up 3 months check right knee

## 2014-10-06 ENCOUNTER — Telehealth: Payer: Self-pay | Admitting: Orthopedic Surgery

## 2014-10-08 ENCOUNTER — Other Ambulatory Visit: Payer: Self-pay | Admitting: *Deleted

## 2014-10-08 MED ORDER — TRAMADOL-ACETAMINOPHEN 37.5-325 MG PO TABS: 1.0000 | ORAL_TABLET | ORAL | Status: DC | PRN

## 2014-10-08 NOTE — Telephone Encounter (Signed)
Call received from [facility], Gregary SignsSean, pharmacist, inquiring about the Tramadol refill - states it had been requested 2 days ago. It appears to be in the system as of 10/06/14, please advise. Pharmacy Ph# [phone]

## 2014-10-08 NOTE — Telephone Encounter (Signed)
Medication faxed to pharmacy 

## 2014-10-16 ENCOUNTER — Other Ambulatory Visit: Payer: Self-pay | Admitting: Orthopedic Surgery

## 2014-10-20 ENCOUNTER — Other Ambulatory Visit: Payer: Self-pay | Admitting: *Deleted

## 2014-10-20 MED ORDER — DICLOFENAC POTASSIUM 50 MG PO TABS: 50.0000 mg | ORAL_TABLET | Freq: Two times a day (BID) | ORAL | Status: DC

## 2014-10-26 ENCOUNTER — Encounter: Payer: Self-pay | Admitting: Orthopedic Surgery

## 2014-10-26 ENCOUNTER — Ambulatory Visit (INDEPENDENT_AMBULATORY_CARE_PROVIDER_SITE_OTHER): Payer: 59 | Admitting: Orthopedic Surgery

## 2014-10-26 VITALS — BP 139/72 | Ht 69.0 in | Wt 229.0 lb

## 2014-10-26 DIAGNOSIS — M1711 Unilateral primary osteoarthritis, right knee: Secondary | ICD-10-CM

## 2014-10-26 NOTE — Progress Notes (Signed)
Patient ID: Chris Wade, male   DOB: October 27, 1950, 64 y.o.   MRN: [id number]  Chief Complaint  Patient presents with  . Follow-up    3 month recheck left knee    HPI Chris BrookingLarry W Wade is a 64 y.o. male.  has been treated with oral medication of ultrasound and anti-inflammatory with good result and his right knee. He said he did have an episode 2 weeks ago where it didn't really bother him but subsequently   Past Medical History  Diagnosis Date  . Hypertension   . Hypercholesteremia   . Diabetes mellitus without complication   . Shortness of breath     Past Surgical History  Procedure Laterality Date  . Thyroidectomy    . Back surgery      ruptured disc  . Prostate biopsy N/A 01/20/2014    Procedure: PROSTATE BIOPSY;  Surgeon: Ky BarbanMohammad I Javaid, MD;  Location: [facility];  Service: Urology;  Laterality: N/A;     No Known Allergies  Current Outpatient Prescriptions  Medication Sig Dispense Refill  . alfuzosin (UROXATRAL) 10 MG 24 hr tablet Take 10 mg by mouth daily with breakfast.    . atorvastatin (LIPITOR) 10 MG tablet Take 10 mg by mouth daily.    . Dapagliflozin Propanediol (FARXIGA) 10 MG TABS Take 1 tablet by mouth daily.    . diclofenac (CATAFLAM) 50 MG tablet Take 1 tablet (50 mg total) by mouth 2 (two) times daily. 60 tablet 5  . finasteride (PROSCAR) 5 MG tablet Take 5 mg by mouth daily.    . insulin aspart (NOVOLOG) 100 UNIT/ML injection Inject 8-12 Units into the skin 3 (three) times daily before meals. SLIDING SCALE    . insulin glargine (LANTUS) 100 UNIT/ML injection Inject 50 Units into the skin 2 (two) times daily.    . irbesartan (AVAPRO) 150 MG tablet Take 150 mg by mouth daily.    . traMADol-acetaminophen (ULTRACET) 37.5-325 MG per tablet Take 1 tablet by mouth every 4 (four) hours as needed. 60 tablet 3   No current facility-administered medications for this visit.    Review of Systems Review of Systems  Constitutional: Negative for fever.  Neurological:  Negative for numbness.     Physical Exam Blood pressure 139/72, height 5\' 9"  (1.753 m), weight 229 lb (103.874 kg).  Physical Exam  Constitutional: He is oriented to person, place, and time. He appears well-developed and well-nourished.  Musculoskeletal:  Varus alignment to his knee walks with no assistive device no varus thrust  No medial joint line tenderness or effusion is a slight flexion contracture but knee flexion 120 of flexion ARC motor exam normal skin intact meniscal signs are negative  Neurological: He is oriented to person, place, and time.  Psychiatric: He has a normal mood and affect.  Vitals reviewed.   Data Reviewed Chart review previous note review  Assessment    Stable osteoarthritis    Plan    Continue Cataflam and Ultracet follow-up with me in 3 months       Fuller CanadaStanley Harrison 10/26/2014, 9:46 AM

## 2015-02-09 ENCOUNTER — Ambulatory Visit (INDEPENDENT_AMBULATORY_CARE_PROVIDER_SITE_OTHER): Payer: 59 | Admitting: Orthopedic Surgery

## 2015-02-09 ENCOUNTER — Encounter: Payer: Self-pay | Admitting: Orthopedic Surgery

## 2015-02-09 VITALS — BP 135/84 | Ht 69.0 in | Wt 229.0 lb

## 2015-02-09 DIAGNOSIS — M1711 Unilateral primary osteoarthritis, right knee: Secondary | ICD-10-CM

## 2015-02-09 NOTE — Progress Notes (Signed)
Patient ID: Chris Wade, male   DOB: [birth year], 64 y.o.   MRN: [id number] Chief Complaint  Patient presents with  . Follow-up    3 month recheck on right knee after takings medication.    64 year old male treated with diclofenac 50 twice a day for right knee pain doing well has occasional flareups related to whether  Denies any major mechanical symptoms on review of systems  Examination shows varus alignment to the knee but he walks without assistive device he has medial joint line tenderness slight effusion mild flexion contracture but knee flexion is 120 neurovascular exam is intact meniscal signs are negative and his good quadriceps muscle tone  Stable arthritis follow-up 3 months continue current medications of Cataflam and Ultracet follow-up x-ray in 3 months last x-ray from 2015 shows moderate arthritis.

## 2015-04-22 ENCOUNTER — Other Ambulatory Visit: Payer: Self-pay | Admitting: Orthopedic Surgery

## 2015-05-13 ENCOUNTER — Ambulatory Visit (INDEPENDENT_AMBULATORY_CARE_PROVIDER_SITE_OTHER): Payer: 59

## 2015-05-13 ENCOUNTER — Ambulatory Visit (INDEPENDENT_AMBULATORY_CARE_PROVIDER_SITE_OTHER): Payer: 59 | Admitting: Orthopedic Surgery

## 2015-05-13 VITALS — BP 137/80 | Ht 69.0 in | Wt 239.0 lb

## 2015-05-13 DIAGNOSIS — M129 Arthropathy, unspecified: Secondary | ICD-10-CM

## 2015-05-13 DIAGNOSIS — M1710 Unilateral primary osteoarthritis, unspecified knee: Secondary | ICD-10-CM

## 2015-05-13 MED ORDER — DICLOFENAC POTASSIUM 50 MG PO TABS: 50.0000 mg | ORAL_TABLET | Freq: Two times a day (BID) | ORAL | Status: DC

## 2015-05-13 MED ORDER — TRAMADOL-ACETAMINOPHEN 37.5-325 MG PO TABS: 1.0000 | ORAL_TABLET | ORAL | Status: DC | PRN

## 2015-05-13 NOTE — Progress Notes (Signed)
Patient ID: Chris Wade, male   DOB: [birth year], 64 y.o.   MRN: [id number]  Follow up visit  Chief Complaint  Patient presents with  . Follow-up    3 month recheck on right knee with xray.    BP 137/80 mmHg  Ht  (1.753 m)  Wt 239 lb (108.41 kg)  BMI 35.28 kg/m2  Encounter Diagnosis  Name Primary?  Marland Kitchen Arthritis of knee Yes     three-month follow-up x-ray for arthritis  Patient on Ultracet and Cataflam doing well. Able to ambulate walk and perform his work activities with no increase in pain.   view of systems negative  Exam shows a varus kneedoes not come to full extension. Flexion is approximately 125 motor exam otherwise normal knee stable sensory exam normal passive exam normal. Has medial joint line tenderness without effusion  X-rays show severe wear the medial compartment with joint space narrowing medially  Varus deformity  Recommend continue with medications follow-up in 6 months repeat x-ray.  Meds ordered this encounter  Medications  . traMADol-acetaminophen (ULTRACET) 37.5-325 MG per tablet    Sig: Take 1 tablet by mouth every 4 (four) hours as needed.    Dispense:  60 tablet    Refill:  3  . diclofenac (CATAFLAM) 50 MG tablet    Sig: Take 1 tablet (50 mg total) by mouth 2 (two) times daily.    Dispense:  60 tablet    Refill:  5

## 2015-10-15 ENCOUNTER — Other Ambulatory Visit (HOSPITAL_COMMUNITY): Payer: Self-pay | Admitting: Internal Medicine

## 2015-10-15 DIAGNOSIS — K7689 Other specified diseases of liver: Secondary | ICD-10-CM

## 2015-10-18 ENCOUNTER — Ambulatory Visit (HOSPITAL_COMMUNITY)
Admission: RE | Admit: 2015-10-18 | Discharge: 2015-10-18 | Disposition: A | Discharge status: Home / Self Care | Payer: 59 | Source: Ambulatory Visit | Attending: Internal Medicine | Admitting: Internal Medicine

## 2015-10-18 DIAGNOSIS — K760 Fatty (change of) liver, not elsewhere classified: Secondary | ICD-10-CM | POA: Diagnosis not present

## 2015-10-18 DIAGNOSIS — K7689 Other specified diseases of liver: Secondary | ICD-10-CM | POA: Insufficient documentation

## 2015-10-28 ENCOUNTER — Other Ambulatory Visit (HOSPITAL_COMMUNITY): Payer: Self-pay | Admitting: Internal Medicine

## 2015-10-28 DIAGNOSIS — R1084 Generalized abdominal pain: Secondary | ICD-10-CM

## 2015-11-03 ENCOUNTER — Ambulatory Visit (HOSPITAL_COMMUNITY): Admission: RE | Admit: 2015-11-03 | Payer: 59 | Source: Ambulatory Visit | Admitting: Internal Medicine

## 2015-11-11 ENCOUNTER — Ambulatory Visit: Payer: 59 | Admitting: Orthopedic Surgery

## 2015-11-15 ENCOUNTER — Encounter: Payer: Self-pay | Admitting: Orthopedic Surgery

## 2015-11-15 ENCOUNTER — Ambulatory Visit (INDEPENDENT_AMBULATORY_CARE_PROVIDER_SITE_OTHER): Payer: 59

## 2015-11-15 ENCOUNTER — Ambulatory Visit (INDEPENDENT_AMBULATORY_CARE_PROVIDER_SITE_OTHER): Payer: 59 | Admitting: Orthopedic Surgery

## 2015-11-15 VITALS — BP 129/74 | Ht 69.0 in | Wt 235.0 lb

## 2015-11-15 DIAGNOSIS — M129 Arthropathy, unspecified: Secondary | ICD-10-CM

## 2015-11-15 DIAGNOSIS — M25561 Pain in right knee: Secondary | ICD-10-CM

## 2015-11-15 DIAGNOSIS — M1710 Unilateral primary osteoarthritis, unspecified knee: Secondary | ICD-10-CM

## 2015-11-15 MED ORDER — TRAMADOL-ACETAMINOPHEN 37.5-325 MG PO TABS: 1.0000 | ORAL_TABLET | ORAL | Status: DC | PRN

## 2015-11-15 MED ORDER — DICLOFENAC POTASSIUM 50 MG PO TABS: 50.0000 mg | ORAL_TABLET | Freq: Three times a day (TID) | ORAL | Status: DC

## 2015-11-15 NOTE — Patient Instructions (Signed)
I increased her medication Maintain active lifestyle Walk for exercise Keep your weight down

## 2015-11-15 NOTE — Progress Notes (Addendum)
Patient ID: Chris Wade, male   DOB: Jan 29, 1951, 65 y.o.   MRN: [id number]  Chief Complaint  Patient presents with  . Follow-up    FOLLOW UP RIGHT KNEE    HPI 65 year old male followed for osteoarthritis right knee currently on Cataflam and Ultracet. He still working has intermittent flareups of knee pain no swelling  Review of Systems  Musculoskeletal: Positive for joint pain and falls.    BP 129/74 mmHg  Ht 5\' 9"  (1.753 m)  Wt 235 lb (106.595 kg)  BMI 34.69 kg/m2  Physical Exam  Constitutional: He is oriented to person, place, and time. He appears well-developed and well-nourished. No distress.  Cardiovascular: Normal rate and intact distal pulses.   Musculoskeletal:  Right knee varus alignment medial joint line tenderness mild no swelling. Ligaments are stable in all directions. Muscle tone and strength are normal skin shows abrasion from a fall distal ankle no edema sensation normal. Flexion extension painless today.  Neurological: He is alert and oriented to person, place, and time.  Skin: Skin is warm and dry. No rash noted. He is not diaphoretic. No erythema. No pallor.  Psychiatric: He has a normal mood and affect. His behavior is normal. Judgment and thought content normal.    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 diclofenac (CATAFLAM) 50 MG tablet    Sig: Take 1 tablet (50 mg total) by mouth 3 (three) times daily.    Dispense:  90 tablet    Refill:  5  . traMADol-acetaminophen (ULTRACET) 37.5-325 MG tablet    Sig: Take 1 tablet by mouth every 4 (four) hours as needed.    Dispense:  60 tablet    Refill:  3

## 2015-12-06 ENCOUNTER — Other Ambulatory Visit: Payer: Self-pay | Admitting: *Deleted

## 2015-12-06 DIAGNOSIS — M25561 Pain in right knee: Secondary | ICD-10-CM

## 2015-12-06 DIAGNOSIS — M1710 Unilateral primary osteoarthritis, unspecified knee: Secondary | ICD-10-CM

## 2015-12-06 MED ORDER — DICLOFENAC POTASSIUM 50 MG PO TABS: 50.0000 mg | ORAL_TABLET | Freq: Three times a day (TID) | ORAL | Status: DC

## 2015-12-13 ENCOUNTER — Other Ambulatory Visit: Payer: Self-pay | Admitting: *Deleted

## 2015-12-13 DIAGNOSIS — M1710 Unilateral primary osteoarthritis, unspecified knee: Secondary | ICD-10-CM

## 2015-12-13 DIAGNOSIS — M25561 Pain in right knee: Secondary | ICD-10-CM

## 2015-12-13 MED ORDER — DICLOFENAC POTASSIUM 50 MG PO TABS: 50.0000 mg | ORAL_TABLET | Freq: Three times a day (TID) | ORAL | Status: DC

## 2016-05-15 ENCOUNTER — Encounter: Payer: Self-pay | Admitting: Orthopedic Surgery

## 2016-05-15 ENCOUNTER — Ambulatory Visit (INDEPENDENT_AMBULATORY_CARE_PROVIDER_SITE_OTHER): Payer: 59 | Admitting: Orthopedic Surgery

## 2016-05-15 VITALS — BP 119/71 | HR 107 | Ht 69.0 in | Wt 220.0 lb

## 2016-05-15 DIAGNOSIS — M25561 Pain in right knee: Secondary | ICD-10-CM

## 2016-05-15 DIAGNOSIS — M1711 Unilateral primary osteoarthritis, right knee: Secondary | ICD-10-CM | POA: Diagnosis not present

## 2016-05-15 MED ORDER — DICLOFENAC SODIUM 50 MG PO TBEC: 50.0000 mg | DELAYED_RELEASE_TABLET | Freq: Two times a day (BID) | ORAL | 5 refills | Status: DC

## 2016-05-15 NOTE — Progress Notes (Signed)
Patient ID: Chris Wade, male   DOB: 08-01-1951, 65 y.o.   MRN: [id number]  Chief Complaint  Patient presents with  . Follow-up    Recheck on right knee.    HPI Chris Wade is a 65 y.o. male.   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 appearance the patient's appearance is normal with normal grooming and  hygiene The patient is oriented to person place and time Mood and affect are normal   Ortho Exam Gait is remarkable for a normal heel toe gait. He does have a varus kneeif he can thrust flexion is excellent 125 has a 10 flexion contracture ligaments are stable manual muscle testing of his extensor mechanism is is normal skin has some old scars in no acute erythema or laceration  Medical decision-making Diagnosis, Data, Plan (risk)  Refilled his medication diclofenac 50 mg twice a day  Follow-up 6 months for x-rays right knee  Patient is candidate for knee replacement it he is considering retirement  Chris CanadaStanley Harrison, MD 05/15/2016 9:38 AM

## 2016-05-26 ENCOUNTER — Emergency Department (HOSPITAL_COMMUNITY): Payer: 59

## 2016-05-26 ENCOUNTER — Encounter (HOSPITAL_COMMUNITY): Payer: Self-pay | Admitting: *Deleted

## 2016-05-26 ENCOUNTER — Emergency Department (HOSPITAL_COMMUNITY)
Admission: EM | Admit: 2016-05-26 | Discharge: 2016-05-27 | Disposition: A | Discharge status: Home / Self Care | Payer: 59 | Attending: Emergency Medicine | Admitting: Emergency Medicine

## 2016-05-26 DIAGNOSIS — R1033 Periumbilical pain: Secondary | ICD-10-CM | POA: Insufficient documentation

## 2016-05-26 DIAGNOSIS — Z794 Long term (current) use of insulin: Secondary | ICD-10-CM | POA: Insufficient documentation

## 2016-05-26 DIAGNOSIS — R112 Nausea with vomiting, unspecified: Secondary | ICD-10-CM | POA: Insufficient documentation

## 2016-05-26 DIAGNOSIS — Z7982 Long term (current) use of aspirin: Secondary | ICD-10-CM | POA: Insufficient documentation

## 2016-05-26 DIAGNOSIS — E119 Type 2 diabetes mellitus without complications: Secondary | ICD-10-CM | POA: Insufficient documentation

## 2016-05-26 DIAGNOSIS — I10 Essential (primary) hypertension: Secondary | ICD-10-CM | POA: Diagnosis not present

## 2016-05-26 DIAGNOSIS — R52 Pain, unspecified: Secondary | ICD-10-CM

## 2016-05-26 DIAGNOSIS — Z79899 Other long term (current) drug therapy: Secondary | ICD-10-CM | POA: Insufficient documentation

## 2016-05-26 DIAGNOSIS — Z7984 Long term (current) use of oral hypoglycemic drugs: Secondary | ICD-10-CM | POA: Insufficient documentation

## 2016-05-26 DIAGNOSIS — Z87891 Personal history of nicotine dependence: Secondary | ICD-10-CM | POA: Diagnosis not present

## 2016-05-26 DIAGNOSIS — R109 Unspecified abdominal pain: Secondary | ICD-10-CM

## 2016-05-26 LAB — URINALYSIS, ROUTINE W REFLEX MICROSCOPIC
Bilirubin Urine: NEGATIVE
GLUCOSE, UA: NEGATIVE mg/dL
Hgb urine dipstick: NEGATIVE
Leukocytes, UA: NEGATIVE
Nitrite: NEGATIVE
PH: 5.5 (ref 5.0–8.0)
Protein, ur: NEGATIVE mg/dL
Specific Gravity, Urine: >1.03 — ABNORMAL HIGH (ref 1.005–1.030)

## 2016-05-26 LAB — CBC WITH DIFFERENTIAL/PLATELET
BASOS ABS: 0.1 10*3/uL (ref 0.0–0.1)
BASOS PCT: 1 %
Eosinophils Absolute: 0.1 10*3/uL (ref 0.0–0.7)
Eosinophils Relative: 1 %
HEMATOCRIT: 39.4 % (ref 39.0–52.0)
Hemoglobin: 13.3 g/dL (ref 13.0–17.0)
Lymphocytes Relative: 31 %
Lymphs Abs: 2.4 10*3/uL (ref 0.7–4.0)
MCH: 26.3 pg (ref 26.0–34.0)
MCHC: 33.8 g/dL (ref 30.0–36.0)
MCV: 77.9 fL — ABNORMAL LOW (ref 78.0–100.0)
Monocytes Absolute: 0.5 10*3/uL (ref 0.1–1.0)
Monocytes Relative: 7 %
NEUTROS ABS: 4.6 10*3/uL (ref 1.7–7.7)
Neutrophils Relative %: 60 %
PLATELETS: 244 10*3/uL (ref 150–400)
RBC: 5.06 MIL/uL (ref 4.22–5.81)
RDW: 13.7 % (ref 11.5–15.5)
WBC: 7.5 10*3/uL (ref 4.0–10.5)

## 2016-05-26 LAB — COMPREHENSIVE METABOLIC PANEL
ALBUMIN: 4.1 g/dL (ref 3.5–5.0)
ALT: 19 U/L (ref 17–63)
AST: 16 U/L (ref 15–41)
Alkaline Phosphatase: 84 U/L (ref 38–126)
Anion gap: 8 (ref 5–15)
BILIRUBIN TOTAL: 0.8 mg/dL (ref 0.3–1.2)
BUN: 30 mg/dL — AB (ref 6–20)
CHLORIDE: 105 mmol/L (ref 101–111)
CO2: 26 mmol/L (ref 22–32)
Calcium: 9.3 mg/dL (ref 8.9–10.3)
Creatinine, Ser: 1.66 mg/dL — ABNORMAL HIGH (ref 0.61–1.24)
GFR calc Af Amer: 49 mL/min — ABNORMAL LOW (ref >60)
GFR calc non Af Amer: 42 mL/min — ABNORMAL LOW (ref >60)
GLUCOSE: 195 mg/dL — AB (ref 65–99)
POTASSIUM: 4.7 mmol/L (ref 3.5–5.1)
Sodium: 139 mmol/L (ref 135–145)
Total Protein: 7.5 g/dL (ref 6.5–8.1)

## 2016-05-26 LAB — LIPASE, BLOOD: Lipase: 72 U/L — ABNORMAL HIGH (ref 11–51)

## 2016-05-26 MED ORDER — ONDANSETRON HCL 4 MG/2ML IJ SOLN
4.0000 mg | Freq: Once | INTRAMUSCULAR | Status: AC
  Administered 2016-05-26: 4 mg via INTRAVENOUS
  Filled 2016-05-26: qty 2

## 2016-05-26 MED ORDER — SODIUM CHLORIDE 0.9 % IV BOLUS (SEPSIS)
1000.0000 mL | Freq: Once | INTRAVENOUS | Status: AC
  Administered 2016-05-26: 1000 mL via INTRAVENOUS

## 2016-05-26 NOTE — ED Triage Notes (Signed)
Pt c/o abd pain, nausea that started a few days ago, diarrhea this am,

## 2016-05-26 NOTE — ED Provider Notes (Signed)
[facility] Provider Note   CSN: [id number] Arrival date & time: 05/26/16  2036  By signing my name below, I, Modena Jansky, attest that this documentation has been prepared under the direction and in the presence of Glynn Octave, MD . Electronically Signed: Modena Jansky, Scribe. 05/26/2016. 9:09 PM.  History   Chief Complaint Chief Complaint  Patient presents with  . Abdominal Pain   The history is provided by the patient. No language interpreter was used.   HPI Comments: Chris Wade is a 65 y.o. male who presents to the Emergency Department complaining of intermittent moderate mid abdominal pain that started less than a week ago. He describes the pain as a cramping sensation with episodes lasting about 10 minutes. He reports associated symptoms of nausea, appetite loss, and diarrhea. He describes his diarrhea has intermittent with 2 episodes total. His last nornal BM was 3 days ago. He states he has a hx of Insulin controlled DM, hyperlipidemia, HTN, and enlarged prostate. He denies any hx of abdominal problems, hx of abdominal surgeries, hx of heart failure, hx of smoking, vomiting, fever, dysuria, hematuria, or chest pain.      PCP: Pearson Grippe, MD  Past Medical History:  Diagnosis Date  . Diabetes mellitus without complication (HCC)   . Hypercholesteremia   . Hypertension   . Shortness of breath     Patient Active Problem List   Diagnosis Date Noted  . Primary osteoarthritis of right knee 06/11/2014    Past Surgical History:  Procedure Laterality Date  . BACK SURGERY     ruptured disc  . PROSTATE BIOPSY N/A 01/20/2014   Procedure: PROSTATE BIOPSY;  Surgeon: Ky Barban, MD;  Location: [facility];  Service: Urology;  Laterality: N/A;  . THYROIDECTOMY         Home Medications    Prior to Admission medications   Medication Sig Start Date End Date Taking? Authorizing Provider  alfuzosin (UROXATRAL) 10 MG 24 hr tablet Take 10 mg by mouth every evening.     Yes Historical Provider, MD  aspirin 81 MG tablet Take 81 mg by mouth daily.   Yes Historical Provider, MD  atorvastatin (LIPITOR) 20 MG tablet Take 20 mg by mouth daily. 04/26/16  Yes Historical Provider, MD  diclofenac (VOLTAREN) 50 MG EC tablet Take 1 tablet (50 mg total) by mouth 2 (two) times daily. 05/15/16  Yes Vickki Hearing, MD  finasteride (PROSCAR) 5 MG tablet Take 5 mg by mouth every evening.    Yes Historical Provider, MD  insulin aspart (NOVOLOG) 100 UNIT/ML injection Inject 8-12 Units into the skin 3 (three) times daily before meals. SLIDING SCALE   Yes Historical Provider, MD  irbesartan (AVAPRO) 150 MG tablet Take 150 mg by mouth daily.   Yes Historical Provider, MD  metFORMIN (GLUCOPHAGE) 1000 MG tablet Take 1,000 mg by mouth 2 (two) times daily with a meal.  04/10/16  Yes Historical Provider, MD  traMADol-acetaminophen (ULTRACET) 37.5-325 MG tablet Take 1 tablet by mouth every 4 (four) hours as needed. Patient taking differently: Take 1 tablet by mouth every 4 (four) hours as needed for moderate pain or severe pain.  11/15/15  Yes Vickki Hearing, MD  VICTOZA 18 MG/3ML SOPN Inject 1.8 mg into the skin daily.  05/17/16  Yes Historical Provider, MD    Family History No family history on file.  Social History Social History  Substance Use Topics  . Smoking status: Former Smoker    Packs/day: 1.00  Years: 30.00    Types: Cigarettes    Quit date: 01/14/2004  . Smokeless tobacco: Never Used  . Alcohol use No     Allergies   Review of patient's allergies indicates no known allergies.   Review of Systems Review of Systems A complete 10 system review of systems was obtained and all systems are negative except as noted in the HPI and PMH.   Physical Exam Updated Vital Signs BP 113/69 (BP Location: Right Arm)   Pulse 107   Temp 98.3 F (36.8 C) (Oral)   Resp 16   Ht 5\' 9"  (1.753 m)   Wt 212 lb (96.2 kg)   SpO2 94%   BMI 31.31 kg/m   Physical Exam    Constitutional: He is oriented to person, place, and time. He appears well-developed and well-nourished. No distress.  HENT:  Head: Normocephalic and atraumatic.  Mouth/Throat: Oropharynx is clear and moist. No oropharyngeal exudate.  Moist mucous membranes.   Eyes: Conjunctivae and EOM are normal. Pupils are equal, round, and reactive to light.  Neck: Normal range of motion. Neck supple.  No meningismus.  Cardiovascular: Normal rate, regular rhythm, normal heart sounds and intact distal pulses.   No murmur heard. Pulmonary/Chest: Effort normal and breath sounds normal. No respiratory distress.  Abdominal: Soft. There is tenderness. There is no rebound and no guarding.  Periumbilical TTP with no rebound or guarding.   Musculoskeletal: Normal range of motion. He exhibits no edema or tenderness.  Neurological: He is alert and oriented to person, place, and time. No cranial nerve deficit. He exhibits normal muscle tone. Coordination normal.   5/5 strength throughout. CN 2-12 intact. Equal grip strength.   Skin: Skin is warm.  Psychiatric: He has a normal mood and affect. His behavior is normal.  Nursing note and vitals reviewed.    ED Treatments / Results  DIAGNOSTIC STUDIES: Oxygen Saturation is 94% on RA, normal by my interpretation.    COORDINATION OF CARE: 9:13 PM- Pt advised of plan for treatment and pt agrees.  Labs (all labs ordered are listed, but only abnormal results are displayed) Labs Reviewed  CBC WITH DIFFERENTIAL/PLATELET - Abnormal; Notable for the following:       Result Value   MCV 77.9 (*)    All other components within normal limits  COMPREHENSIVE METABOLIC PANEL - Abnormal; Notable for the following:    Glucose, Bld 195 (*)    BUN 30 (*)    Creatinine, Ser 1.66 (*)    GFR calc non Af Amer 42 (*)    GFR calc Af Amer 49 (*)    All other components within normal limits  LIPASE, BLOOD - Abnormal; Notable for the following:    Lipase 72 (*)    All other  components within normal limits  URINALYSIS, ROUTINE W REFLEX MICROSCOPIC (NOT AT [facility]) - Abnormal; Notable for the following:    Specific Gravity, Urine >1.030 (*)    Ketones, ur TRACE (*)    All other components within normal limits    EKG  EKG Interpretation None       Radiology Ct Abdomen Pelvis Wo Contrast  Result Date: 05/27/2016 CLINICAL DATA:  Acute onset of epigastric abdominal pain, nausea and vomiting. Diarrhea. Initial encounter. EXAM: CT ABDOMEN AND PELVIS WITHOUT CONTRAST TECHNIQUE: Multidetector CT imaging of the abdomen and pelvis was performed following the standard protocol without IV contrast. COMPARISON:  Abdominal ultrasound performed 10/18/2015 FINDINGS: Lower chest: The visualized lung bases are grossly clear. The  visualized portions of the mediastinum are unremarkable. Hepatobiliary: The liver is unremarkable in appearance. The gallbladder is unremarkable in appearance. The common bile duct remains normal in caliber. Pancreas: The pancreas is within normal limits. Spleen: The spleen is unremarkable in appearance. Adrenals/Urinary Tract: The adrenal glands are unremarkable in appearance. The kidneys are within normal limits. There is no evidence of hydronephrosis. No renal or ureteral stones are identified. Nonspecific perinephric stranding is noted bilaterally. Stomach/Bowel: The stomach is unremarkable in appearance. The small bowel is within normal limits. The appendix is normal in caliber, without evidence of appendicitis. The colon is unremarkable in appearance. Vascular/Lymphatic: Scattered calcification is seen along the abdominal aorta and its branches. The abdominal aorta is otherwise grossly unremarkable. The inferior vena cava is grossly unremarkable. No retroperitoneal lymphadenopathy is seen. No pelvic sidewall lymphadenopathy is identified. Reproductive: The bladder is mildly distended and grossly unremarkable. The prostate is enlarged, measuring up to 5.7 cm in  AP dimension, with impression on the base of the bladder. Other: No additional soft tissue abnormalities are seen. Musculoskeletal: No acute osseous abnormalities are identified. The visualized musculature is unremarkable in appearance. IMPRESSION: 1. No acute abnormality seen within the abdomen or pelvis. 2. Enlarged prostate, with impression on the base of the bladder. Would correlate with PSA. 3. Scattered aortic atherosclerosis noted. Electronically Signed   By: Roanna Raider M.D.   On: 05/27/2016 00:53    Procedures Procedures (including critical care time)  Medications Ordered in ED Medications  sodium chloride 0.9 % bolus 1,000 mL (not administered)  ondansetron (ZOFRAN) injection 4 mg (not administered)     Initial Impression / Assessment and Plan / ED Course  I have reviewed the triage vital signs and the nursing notes.  Pertinent labs & imaging results that were available during my care of the patient were reviewed by me and considered in my medical decision making (see chart for details).  Clinical Course  1 week of intermittent crampy abdominal pain with nausea and diarrhea.  IVF, symptom control Lipase 72, cr 1.6. Anion gap normal  CT noncontrast shows no acute pathology.  Patient aware of enlarged prostate.  He is tolerating PO.  Advised PCP followup to recheck lipase and creatinine next week. Abdominal pain has improved, nausea improved.  Return precautions discussed.  Final Clinical Impressions(s) / ED Diagnoses   Final diagnoses:  Abdominal pain, unspecified abdominal location  Non-intractable vomiting with nausea, unspecified vomiting type    New Prescriptions New Prescriptions   No medications on file  I personally performed the services described in this documentation, which was scribed in my presence. The recorded information has been reviewed and is accurate.     Glynn Octave, MD 05/27/16 [id number]

## 2016-05-27 MED ORDER — ONDANSETRON HCL 4 MG/2ML IJ SOLN
4.0000 mg | Freq: Once | INTRAMUSCULAR | Status: AC
  Administered 2016-05-27: 4 mg via INTRAVENOUS
  Filled 2016-05-27: qty 2

## 2016-05-27 MED ORDER — ONDANSETRON HCL 4 MG PO TABS: 4.0000 mg | ORAL_TABLET | Freq: Four times a day (QID) | ORAL | 0 refills | Status: DC

## 2016-05-27 NOTE — Discharge Instructions (Signed)
Keep yourself hydrated. Followup with Dr. Selena BattenKim for a recheck of your kidney function and pancreas function next week. Return to the ED if you develop new or worsening symptoms.

## 2016-05-27 NOTE — ED Notes (Signed)
Pt consumed a can of diet ginger ale with no n/v afterward

## 2016-11-13 ENCOUNTER — Ambulatory Visit: Payer: 59

## 2016-11-13 ENCOUNTER — Encounter: Payer: 59 | Admitting: Orthopedic Surgery

## 2016-11-27 ENCOUNTER — Other Ambulatory Visit: Payer: Self-pay | Admitting: *Deleted

## 2016-11-27 DIAGNOSIS — M25561 Pain in right knee: Secondary | ICD-10-CM

## 2016-11-27 DIAGNOSIS — M1710 Unilateral primary osteoarthritis, unspecified knee: Secondary | ICD-10-CM

## 2016-11-27 MED ORDER — TRAMADOL-ACETAMINOPHEN 37.5-325 MG PO TABS: 1.0000 | ORAL_TABLET | ORAL | 3 refills | Status: DC | PRN

## 2016-12-01 ENCOUNTER — Encounter: Payer: Self-pay | Admitting: Orthopedic Surgery

## 2016-12-01 ENCOUNTER — Ambulatory Visit (INDEPENDENT_AMBULATORY_CARE_PROVIDER_SITE_OTHER): Payer: 59 | Admitting: Orthopedic Surgery

## 2016-12-01 ENCOUNTER — Ambulatory Visit (INDEPENDENT_AMBULATORY_CARE_PROVIDER_SITE_OTHER): Payer: 59

## 2016-12-01 DIAGNOSIS — M1711 Unilateral primary osteoarthritis, right knee: Secondary | ICD-10-CM

## 2016-12-01 NOTE — Progress Notes (Signed)
.   Follow up   Chief Complaint  Patient presents with  . Follow-up    RIGHT KNEE OA    66 year old male with osteoarthritis of his right knee followed for such. No change in symptoms no better nor worse occasional ache relieved by Ultracet  Review of systems musculoskeletal no other complaints of joint pain or arthralgias  Right knee is 5-125 degrees and range of motion knees and varus he walks with a mild thrust. He has minimal tenderness to palpation over the medial joint line without effusion is stability remains normal muscle strength and tone are excellent skin is intact and has no sensory deficits  We will take an x-ray today to see how much progression we've had  X-ray shows no progression in his arthritis he still has the varus osteoarthritis with joint space narrowing cyst formation and sclerosis  We will see him in a year or sooner if he has any trouble  Encounter Diagnosis  Name Primary?  Marland Kitchen Arthritis of right knee Yes

## 2017-02-07 ENCOUNTER — Encounter (HOSPITAL_COMMUNITY): Payer: Self-pay | Admitting: *Deleted

## 2017-02-07 ENCOUNTER — Emergency Department (HOSPITAL_COMMUNITY)
Admission: EM | Admit: 2017-02-07 | Discharge: 2017-02-07 | Disposition: A | Discharge status: Home / Self Care | Payer: 59 | Attending: Emergency Medicine | Admitting: Emergency Medicine

## 2017-02-07 ENCOUNTER — Emergency Department (HOSPITAL_COMMUNITY): Payer: 59

## 2017-02-07 DIAGNOSIS — S6992XA Unspecified injury of left wrist, hand and finger(s), initial encounter: Secondary | ICD-10-CM | POA: Diagnosis present

## 2017-02-07 DIAGNOSIS — Y999 Unspecified external cause status: Secondary | ICD-10-CM | POA: Insufficient documentation

## 2017-02-07 DIAGNOSIS — Z794 Long term (current) use of insulin: Secondary | ICD-10-CM | POA: Diagnosis not present

## 2017-02-07 DIAGNOSIS — Z7984 Long term (current) use of oral hypoglycemic drugs: Secondary | ICD-10-CM | POA: Insufficient documentation

## 2017-02-07 DIAGNOSIS — S61211A Laceration without foreign body of left index finger without damage to nail, initial encounter: Secondary | ICD-10-CM | POA: Diagnosis not present

## 2017-02-07 DIAGNOSIS — Z23 Encounter for immunization: Secondary | ICD-10-CM | POA: Diagnosis not present

## 2017-02-07 DIAGNOSIS — E119 Type 2 diabetes mellitus without complications: Secondary | ICD-10-CM | POA: Insufficient documentation

## 2017-02-07 DIAGNOSIS — Y929 Unspecified place or not applicable: Secondary | ICD-10-CM | POA: Diagnosis not present

## 2017-02-07 DIAGNOSIS — W268XXA Contact with other sharp object(s), not elsewhere classified, initial encounter: Secondary | ICD-10-CM | POA: Diagnosis not present

## 2017-02-07 DIAGNOSIS — Y939 Activity, unspecified: Secondary | ICD-10-CM | POA: Diagnosis not present

## 2017-02-07 DIAGNOSIS — I10 Essential (primary) hypertension: Secondary | ICD-10-CM | POA: Diagnosis not present

## 2017-02-07 DIAGNOSIS — Z87891 Personal history of nicotine dependence: Secondary | ICD-10-CM | POA: Diagnosis not present

## 2017-02-07 DIAGNOSIS — Z79899 Other long term (current) drug therapy: Secondary | ICD-10-CM | POA: Diagnosis not present

## 2017-02-07 MED ORDER — TETANUS-DIPHTH-ACELL PERTUSSIS 5-2.5-18.5 LF-MCG/0.5 IM SUSP
0.5000 mL | Freq: Once | INTRAMUSCULAR | Status: AC
  Administered 2017-02-07: 0.5 mL via INTRAMUSCULAR
  Filled 2017-02-07: qty 0.5

## 2017-02-07 MED ORDER — CEPHALEXIN 500 MG PO CAPS: 500.0000 mg | ORAL_CAPSULE | Freq: Four times a day (QID) | ORAL | 0 refills | Status: AC

## 2017-02-07 NOTE — ED Notes (Signed)
Cleaned laceration to left index finger, with wound cleanser.

## 2017-02-07 NOTE — Discharge Instructions (Signed)
Please read attached information regarding wound care. Take Keflex 4 times daily for 1 week. Tylenol or ibuprofen as needed for pain. Follow-up with PCP for further evaluation. Return to ED for worsening pain, fever, increased swelling, temperature change of digit, inability to move finger.

## 2017-02-07 NOTE — ED Triage Notes (Addendum)
Pt was drilling a metal wire and a piece of the metal wire cut pt's left index finger this am around 09:30, no bleeding noted at present,  Reports that his last tetanus was 3-4 years ago,

## 2017-02-07 NOTE — ED Provider Notes (Signed)
[facility]-EMERGENCY DEPT Provider Note   CSN: [id number] Arrival date & time: 02/07/17  2056     History   Chief Complaint Chief Complaint  Patient presents with  . Laceration    HPI Chris Wade is a 66 y.o. male.  HPI  Patient presents to ED for laceration on left index finger that occurred approximately 12 hours prior to arrival. He states that he was using a drill with a metal wire attached to it when the wire caused the laceration on his finger. He reports bleeding that has continued since this morning intermittently even after putting a bandage in the area. Patient states that he is on aspirin 81 mg. Denies any history of bleeding disorder. He states that his last tetanus was "probably 4 years ago" but he is unsure. Denies any numbness, fever, temperature change of digit, additional injury.  Past Medical History:  Diagnosis Date  . Diabetes mellitus without complication (HCC)   . Hypercholesteremia   . Hypertension   . Shortness of breath     Patient Active Problem List   Diagnosis Date Noted  . Primary osteoarthritis of right knee 06/11/2014    Past Surgical History:  Procedure Laterality Date  . BACK SURGERY     ruptured disc  . PROSTATE BIOPSY N/A 01/20/2014   Procedure: PROSTATE BIOPSY;  Surgeon: Ky Barban, MD;  Location: [facility];  Service: Urology;  Laterality: N/A;  . THYROIDECTOMY         Home Medications    Prior to Admission medications   Medication Sig Start Date End Date Taking? Authorizing Provider  alfuzosin (UROXATRAL) 10 MG 24 hr tablet Take 10 mg by mouth every evening.     [provider]  aspirin 81 MG tablet Take 81 mg by mouth daily.    [provider]  atorvastatin (LIPITOR) 20 MG tablet Take 20 mg by mouth daily. 04/26/16   [provider]  cephALEXin (KEFLEX) 500 MG capsule Take 1 capsule (500 mg total) by mouth 4 (four) times daily. 02/07/17 02/14/17  Namari Breton, Hillary Bow, PA-C  diclofenac (VOLTAREN) 50 MG EC tablet  Take 1 tablet (50 mg total) by mouth 2 (two) times daily. 05/15/16   Vickki Hearing, MD  finasteride (PROSCAR) 5 MG tablet Take 5 mg by mouth every evening.     [provider]  insulin aspart (NOVOLOG) 100 UNIT/ML injection Inject 8-12 Units into the skin 3 (three) times daily before meals. SLIDING SCALE    [provider]  irbesartan (AVAPRO) 150 MG tablet Take 150 mg by mouth daily.    [provider]  metFORMIN (GLUCOPHAGE) 1000 MG tablet Take 1,000 mg by mouth 2 (two) times daily with a meal.  04/10/16   [provider]  ondansetron (ZOFRAN) 4 MG tablet Take 1 tablet (4 mg total) by mouth every 6 (six) hours. 05/27/16   Rancour, Jeannett Senior, MD  traMADol-acetaminophen (ULTRACET) 37.5-325 MG tablet Take 1 tablet by mouth every 4 (four) hours as needed. 11/27/16   Vickki Hearing, MD  VICTOZA 18 MG/3ML SOPN Inject 1.8 mg into the skin daily.  05/17/16   [provider]    Family History No family history on file.  Social History Social History  Substance Use Topics  . Smoking status: Former Smoker    Packs/day: 1.00    Years: 30.00    Types: Cigarettes    Quit date: 01/14/2004  . Smokeless tobacco: Never Used  . Alcohol use No  Allergies   Patient has no known allergies.   Review of Systems Review of Systems  Constitutional: Negative for chills and fever.  Gastrointestinal: Negative for nausea and vomiting.  Skin: Positive for wound.  Neurological: Negative for syncope, light-headedness and headaches.     Physical Exam Updated Vital Signs BP 133/82 (BP Location: Right Arm)   Pulse 92   Temp 98 F (36.7 C) (Oral)   Resp 19   Ht 5\' 9"  (1.753 m)   Wt 98.4 kg (217 lb)   SpO2 97%   BMI 32.05 kg/m   Physical Exam  Constitutional: He appears well-developed and well-nourished. No distress.  HENT:  Head: Normocephalic and atraumatic.  Eyes: Conjunctivae and EOM are normal. No scleral icterus.  Neck: Normal range of  motion.  Pulmonary/Chest: Effort normal. No respiratory distress.  Musculoskeletal: Normal range of motion. He exhibits no edema, tenderness or deformity.  Strength 5/5 in wrist and digits. Sensation intact to light touch on lateral and medial aspect of the left index finger. 2+ radial pulses bilaterally.  Neurological: He is alert.  Skin: No rash noted. He is not diaphoretic.  There is a 2 cm laceration on the left index finger as indicated below. There is no active bleeding at this time.  Psychiatric: He has a normal mood and affect.  Nursing note and vitals reviewed.      ED Treatments / Results  Labs (all labs ordered are listed, but only abnormal results are displayed) Labs Reviewed - No data to display  EKG  EKG Interpretation None       Radiology Dg Finger Index Left  Result Date: 02/07/2017 CLINICAL DATA:  Laceration of the index finger EXAM: LEFT INDEX FINGER 2+V COMPARISON:  None. FINDINGS: There is no evidence of fracture or dislocation. There is no evidence of arthropathy or other focal bone abnormality. Soft tissues are unremarkable. No radiopaque foreign body. IMPRESSION: No osseous abnormality or radiopaque foreign body. Electronically Signed   By: Deatra Robinson M.D.   On: 02/07/2017 22:17    Procedures Procedures (including critical care time)  Medications Ordered in ED Medications  Tdap (BOOSTRIX) injection 0.5 mL (0.5 mLs Intramuscular Given 02/07/17 2148)     Initial Impression / Assessment and Plan / ED Course  I have reviewed the triage vital signs and the nursing notes.  Pertinent labs & imaging results that were available during my care of the patient were reviewed by me and considered in my medical decision making (see chart for details).     Patient presents to the ED for laceration that occurred approximately 12 hours prior to arrival. He states that a metal wire on the drill that he was using causes him to have the laceration. On physical exam  there is a 2 cm laceration on the left index finger. Area has normal strength and bleeding is controlled here in the ED with gauze. Sensation intact in medial and lateral aspect of the digit. Full active and passive range of motion of the digit with no signs of tendon injury. Wound is soaked and irrigated and explored for foreign body or deep tissue injury. There was none identified. X-ray was obtained and was negative for foreign body or acute osseous abnormality. Area was dressed with Steri-Strips. No laceration repair since it's been 12 hours since injury. Will place patient on antibiotics to prevent any infection due to his diabetes. Tetanus updated here in the ED today. Patient appears stable for discharge at this time. Strict return precautions  given.  Final Clinical Impressions(s) / ED Diagnoses   Final diagnoses:  Laceration of left index finger without foreign body without damage to nail, initial encounter    New Prescriptions New Prescriptions   CEPHALEXIN (KEFLEX) 500 MG CAPSULE    Take 1 capsule (500 mg total) by mouth 4 (four) times daily.     Dietrich PatesKhatri, Jelissa Espiritu, PA-C 02/07/17 2233    Loren RacerYelverton, David, MD 02/14/17 [phone]

## 2017-03-12 ENCOUNTER — Telehealth: Payer: Self-pay | Admitting: Orthopedic Surgery

## 2017-03-12 ENCOUNTER — Other Ambulatory Visit: Payer: Self-pay | Admitting: *Deleted

## 2017-03-12 DIAGNOSIS — M25561 Pain in right knee: Secondary | ICD-10-CM

## 2017-03-12 DIAGNOSIS — M1710 Unilateral primary osteoarthritis, unspecified knee: Secondary | ICD-10-CM

## 2017-03-12 MED ORDER — DICLOFENAC POTASSIUM 50 MG PO TABS: 50.0000 mg | ORAL_TABLET | Freq: Three times a day (TID) | ORAL | 5 refills | Status: DC

## 2017-03-12 NOTE — Telephone Encounter (Signed)
PATIENT REQUESTS REFILL ON DICLOFENAC SID EC 50 MG  QTY 60  REFILLS 5  Sig: Take 1 tablet (50 mg total) by mouth 2 (two) times daily.

## 2017-06-19 ENCOUNTER — Telehealth: Payer: Self-pay | Admitting: Orthopedic Surgery

## 2017-06-19 NOTE — Telephone Encounter (Signed)
Refill request for Tramadol 37.5-325 mgs.  Qty  60 with 3 refills  Sig: Take 1 tablet by mouth every 4 (four) hours as needed.

## 2017-06-20 ENCOUNTER — Other Ambulatory Visit: Payer: Self-pay | Admitting: Orthopedic Surgery

## 2017-06-20 DIAGNOSIS — M1710 Unilateral primary osteoarthritis, unspecified knee: Secondary | ICD-10-CM

## 2017-06-20 DIAGNOSIS — M25561 Pain in right knee: Secondary | ICD-10-CM

## 2017-06-20 MED ORDER — TRAMADOL-ACETAMINOPHEN 37.5-325 MG PO TABS: 1.0000 | ORAL_TABLET | ORAL | 3 refills | Status: DC | PRN

## 2017-07-05 ENCOUNTER — Emergency Department (HOSPITAL_COMMUNITY)
Admission: EM | Admit: 2017-07-05 | Discharge: 2017-07-05 | Disposition: A | Discharge status: Home / Self Care | Payer: 59 | Attending: Emergency Medicine | Admitting: Emergency Medicine

## 2017-07-05 ENCOUNTER — Other Ambulatory Visit: Payer: Self-pay

## 2017-07-05 ENCOUNTER — Encounter (HOSPITAL_COMMUNITY): Payer: Self-pay | Admitting: *Deleted

## 2017-07-05 DIAGNOSIS — S39012A Strain of muscle, fascia and tendon of lower back, initial encounter: Secondary | ICD-10-CM | POA: Diagnosis not present

## 2017-07-05 DIAGNOSIS — Y939 Activity, unspecified: Secondary | ICD-10-CM | POA: Diagnosis not present

## 2017-07-05 DIAGNOSIS — Z7984 Long term (current) use of oral hypoglycemic drugs: Secondary | ICD-10-CM | POA: Insufficient documentation

## 2017-07-05 DIAGNOSIS — Y998 Other external cause status: Secondary | ICD-10-CM | POA: Insufficient documentation

## 2017-07-05 DIAGNOSIS — E7800 Pure hypercholesterolemia, unspecified: Secondary | ICD-10-CM | POA: Diagnosis not present

## 2017-07-05 DIAGNOSIS — Z7982 Long term (current) use of aspirin: Secondary | ICD-10-CM | POA: Insufficient documentation

## 2017-07-05 DIAGNOSIS — Z79899 Other long term (current) drug therapy: Secondary | ICD-10-CM | POA: Diagnosis not present

## 2017-07-05 DIAGNOSIS — S3992XA Unspecified injury of lower back, initial encounter: Secondary | ICD-10-CM | POA: Diagnosis present

## 2017-07-05 DIAGNOSIS — E119 Type 2 diabetes mellitus without complications: Secondary | ICD-10-CM | POA: Insufficient documentation

## 2017-07-05 DIAGNOSIS — I10 Essential (primary) hypertension: Secondary | ICD-10-CM | POA: Insufficient documentation

## 2017-07-05 DIAGNOSIS — Y92410 Unspecified street and highway as the place of occurrence of the external cause: Secondary | ICD-10-CM | POA: Diagnosis not present

## 2017-07-05 DIAGNOSIS — Z87891 Personal history of nicotine dependence: Secondary | ICD-10-CM | POA: Insufficient documentation

## 2017-07-05 MED ORDER — METHOCARBAMOL 500 MG PO TABS: 500.0000 mg | ORAL_TABLET | Freq: Three times a day (TID) | ORAL | 0 refills | Status: DC
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
## 2017-07-05 NOTE — ED Notes (Addendum)
Not able to use signature pad. Pt verbalized understanding and left without any complaints or questions.

## 2017-07-05 NOTE — Discharge Instructions (Signed)
Alternate ice and heat to your back.  Follow-up with your primary doctor for recheck in a few days if not improving.  Return here for any worsening symptoms such as increasing pain, numbness or weakness to your legs.

## 2017-12-03 ENCOUNTER — Ambulatory Visit (INDEPENDENT_AMBULATORY_CARE_PROVIDER_SITE_OTHER): Payer: 59

## 2017-12-03 ENCOUNTER — Ambulatory Visit (INDEPENDENT_AMBULATORY_CARE_PROVIDER_SITE_OTHER): Payer: 59 | Admitting: Orthopedic Surgery

## 2017-12-03 VITALS — BP 127/69 | HR 87 | Ht 70.0 in | Wt 222.0 lb

## 2017-12-03 DIAGNOSIS — M1710 Unilateral primary osteoarthritis, unspecified knee: Secondary | ICD-10-CM | POA: Diagnosis not present

## 2017-12-03 NOTE — Progress Notes (Signed)
Progress Note   Patient ID: Chris Wade, male   DOB: 05/19/1951, 67 y.o.   MRN: [id number]  Chief Complaint  Patient presents with  . Follow-up    Recheck on right knee.    67 year old male followed for osteoarthritis right knee on a yearly basis currently on diclofenac and tramadol with acetaminophen presents for routine 1 year follow-up complains of mild increase in knee pain, complains of intermittent medial knee pain    Review of Systems  Neurological: Negative for tingling and sensory change.   No outpatient medications have been marked as taking for the 12/03/17 encounter (Office Visit) with Vickki HearingHarrison, Stanley E, MD.    No Known Allergies   BP 127/69   Pulse 87   Ht 5\' 10"  (1.778 m)   Wt 222 lb (100.7 kg)   BMI 31.85 kg/m   Physical Exam  Constitutional: He is oriented to person, place, and time. He appears well-developed and well-nourished.  Vital signs have been reviewed and are stable. Gen. appearance the patient is well-developed and well-nourished with normal grooming and hygiene.   Musculoskeletal:       Legs: Neurological: He is alert and oriented to person, place, and time. Gait abnormal.  Abnormality in gait today is related to the patient having some pain in his feet not his knee  Skin: Skin is warm and dry. No erythema.  Psychiatric: He has a normal mood and affect.  Vitals reviewed.    Medical decision-making Encounter Diagnosis  Name Primary?  Marland Kitchen. Arthritis of knee Yes   X-ray was ordered and read as osteoarthritis right knee varus alignment see my dictated report  Both of us agree that the patient can wait on having a total knee he should keep his weight down take his arthritis medicine as well as his pain medication follow-up with us in a year  Fuller CanadaStanley Harrison, MD 12/03/2017 9:32 AM

## 2018-07-16 ENCOUNTER — Other Ambulatory Visit: Payer: Self-pay | Admitting: Orthopedic Surgery

## 2018-07-16 DIAGNOSIS — M25561 Pain in right knee: Secondary | ICD-10-CM

## 2018-07-16 DIAGNOSIS — M1710 Unilateral primary osteoarthritis, unspecified knee: Secondary | ICD-10-CM

## 2018-12-09 ENCOUNTER — Ambulatory Visit: Payer: 59 | Admitting: Orthopedic Surgery

## 2018-12-30 ENCOUNTER — Ambulatory Visit: Payer: Self-pay | Admitting: Orthopedic Surgery

## 2019-01-27 ENCOUNTER — Ambulatory Visit: Payer: 59 | Admitting: Orthopedic Surgery

## 2019-02-07 ENCOUNTER — Encounter: Payer: Self-pay | Admitting: Orthopedic Surgery

## 2019-02-07 ENCOUNTER — Ambulatory Visit (INDEPENDENT_AMBULATORY_CARE_PROVIDER_SITE_OTHER): Payer: 59

## 2019-02-07 ENCOUNTER — Ambulatory Visit (INDEPENDENT_AMBULATORY_CARE_PROVIDER_SITE_OTHER): Payer: 59 | Admitting: Orthopedic Surgery

## 2019-02-07 ENCOUNTER — Other Ambulatory Visit: Payer: Self-pay

## 2019-02-07 VITALS — BP 152/80 | HR 85 | Temp 96.8°F | Ht 70.0 in | Wt 225.0 lb

## 2019-02-07 DIAGNOSIS — M1711 Unilateral primary osteoarthritis, right knee: Secondary | ICD-10-CM | POA: Diagnosis not present

## 2019-02-07 MED ORDER — TRAMADOL HCL 50 MG PO TABS: 50.0000 mg | ORAL_TABLET | Freq: Four times a day (QID) | ORAL | 5 refills | Status: DC | PRN

## 2019-02-07 NOTE — Patient Instructions (Addendum)
Stop ultracet take tramadol 50 mg   Take  cataflam 50 MG 3 TIMES A DAY

## 2019-02-07 NOTE — Progress Notes (Signed)
Progress Note   Patient ID: Chris Wade, male   DOB: 1950/08/29, 68 y.o.   MRN: [id number]   Chief Complaint  Patient presents with  . Knee Pain    right    Encounter Diagnosis  Name Primary?  . Primary osteoarthritis of right knee Yes    No Known Allergies   Current Outpatient Medications:  .  alfuzosin (UROXATRAL) 10 MG 24 hr tablet, Take 10 mg by mouth every evening. , Disp: , Rfl:  .  aspirin 81 MG tablet, Take 81 mg by mouth daily., Disp: , Rfl:  .  atorvastatin (LIPITOR) 20 MG tablet, Take 20 mg by mouth daily., Disp: , Rfl:  .  diclofenac (CATAFLAM) 50 MG tablet, TAKE 1 TABLET (50 MG TOTAL) BY MOUTH 3 (THREE) TIMES DAILY., Disp: 90 tablet, Rfl: 5 .  finasteride (PROSCAR) 5 MG tablet, Take 5 mg by mouth every evening. , Disp: , Rfl:  .  insulin aspart (NOVOLOG) 100 UNIT/ML injection, Inject 8-12 Units into the skin 3 (three) times daily before meals. SLIDING SCALE, Disp: , Rfl:  .  irbesartan (AVAPRO) 150 MG tablet, Take 150 mg by mouth daily., Disp: , Rfl:  .  metFORMIN (GLUCOPHAGE) 1000 MG tablet, Take 1,000 mg by mouth 2 (two) times daily with a meal. , Disp: , Rfl:  .  traMADol-acetaminophen (ULTRACET) 37.5-325 MG tablet, TAKE 1 TABLET BY MOUTH EVERY 4 HOURS AS NEEDED, Disp: 42 tablet, Rfl: 5 .  VICTOZA 18 MG/3ML SOPN, Inject 1.8 mg into the skin daily. , Disp: , Rfl:  .  methocarbamol (ROBAXIN) 500 MG tablet, Take 1 tablet (500 mg total) 3 (three) times daily by mouth. (Patient not taking: Reported on 02/07/2019), Disp: 21 tablet, Rfl: 0 .  traMADol (ULTRAM) 50 MG tablet, Take 1 tablet (50 mg total) by mouth every 6 (six) hours as needed., Disp: 60 tablet, Rfl: 42   68 year old male with chronic arthritis of his right knee currently on tramadol and Cataflam.  He says his knee pain is worsened with like his medication increased.  He is able to walk without a cane he says he gets intermittent stiffness and intermittent exacerbation of symptoms    Review of Systems   Musculoskeletal: Positive for joint pain.  Neurological: Negative for tingling.    No Known Allergies   BP (!) 152/80   Pulse 85   Temp (!) 96.8 F (36 C)   Ht 5\' 10"  (1.778 m)   Wt 225 lb (102.1 kg)   BMI 32.28 kg/m   Physical Exam Vitals signs and nursing note reviewed.  Constitutional:      Appearance: Normal appearance.  Musculoskeletal:       Legs:  Neurological:     Mental Status: He is alert and oriented to person, place, and time.  Psychiatric:        Mood and Affect: Mood normal.      Medical decisions:  (Established problem worse, x-ray ,physical therapy, over-the-counter medicines, read outside film or summarize x-ray)  Data  Imaging:   Moderate varus deformity severe arthritis medial compartment right knee  Encounter Diagnosis  Name Primary?  . Primary osteoarthritis of right knee Yes    PLAN:   Increase tramadol to 50 mg continue Cataflam 50 mg 3 times a day follow-up in a year repeat x-ray    Arther Abbott, MD 02/07/2019 8:46 AM

## 2019-05-01 ENCOUNTER — Other Ambulatory Visit: Payer: Self-pay

## 2019-05-01 DIAGNOSIS — Z20822 Contact with and (suspected) exposure to covid-19: Secondary | ICD-10-CM

## 2019-05-02 LAB — NOVEL CORONAVIRUS, NAA: SARS-CoV-2, NAA: NOT DETECTED

## 2019-05-21 ENCOUNTER — Other Ambulatory Visit: Payer: Self-pay | Admitting: Orthopedic Surgery

## 2019-05-21 DIAGNOSIS — M1710 Unilateral primary osteoarthritis, unspecified knee: Secondary | ICD-10-CM

## 2019-05-21 DIAGNOSIS — M25561 Pain in right knee: Secondary | ICD-10-CM

## 2019-05-27 ENCOUNTER — Other Ambulatory Visit: Payer: Self-pay

## 2019-05-27 DIAGNOSIS — Z20822 Contact with and (suspected) exposure to covid-19: Secondary | ICD-10-CM

## 2019-05-29 LAB — NOVEL CORONAVIRUS, NAA: SARS-CoV-2, NAA: NOT DETECTED

## 2019-06-03 ENCOUNTER — Telehealth: Payer: Self-pay | Admitting: Internal Medicine

## 2019-06-03 NOTE — Telephone Encounter (Signed)
°  Covid neg results were given to pt

## 2019-06-11 ENCOUNTER — Other Ambulatory Visit: Payer: Self-pay

## 2019-06-11 DIAGNOSIS — Z20822 Contact with and (suspected) exposure to covid-19: Secondary | ICD-10-CM

## 2019-06-12 LAB — NOVEL CORONAVIRUS, NAA: SARS-CoV-2, NAA: NOT DETECTED

## 2019-07-15 ENCOUNTER — Other Ambulatory Visit: Payer: Self-pay | Admitting: Orthopedic Surgery

## 2019-07-15 DIAGNOSIS — M25561 Pain in right knee: Secondary | ICD-10-CM

## 2019-07-15 DIAGNOSIS — M1710 Unilateral primary osteoarthritis, unspecified knee: Secondary | ICD-10-CM

## 2019-08-18 ENCOUNTER — Other Ambulatory Visit: Payer: Self-pay

## 2019-08-18 ENCOUNTER — Ambulatory Visit: Payer: 59

## 2019-08-18 ENCOUNTER — Encounter: Payer: Self-pay | Admitting: Orthopedic Surgery

## 2019-08-18 ENCOUNTER — Ambulatory Visit (INDEPENDENT_AMBULATORY_CARE_PROVIDER_SITE_OTHER): Payer: 59 | Admitting: Orthopedic Surgery

## 2019-08-18 VITALS — BP 143/79 | HR 100 | Ht 70.0 in | Wt 222.0 lb

## 2019-08-18 DIAGNOSIS — M1711 Unilateral primary osteoarthritis, right knee: Secondary | ICD-10-CM

## 2019-08-18 NOTE — Progress Notes (Signed)
Chris Wade  08/18/2019  Body mass index is 31.85 kg/m.   HISTORY SECTION :  Chief Complaint  Patient presents with  . Knee Pain    right    68 year old male presents with chronic right knee pain last seen 6 months ago.  Says his knee is gotten worse over the last month with increased pain decreased function trouble with ambulating without pain.  Intermittent swelling noted.  Pain controlled previously with Ultracet and anti-inflammatories    Review of Systems  Constitutional: Negative for chills and fever.  Respiratory: Negative for shortness of breath.   Cardiovascular: Negative for chest pain.  Skin: Negative.  Negative for itching and rash.     has a past medical history of Diabetes mellitus without complication ([place]), Hypercholesteremia, Hypertension, and Shortness of breath.   Past Surgical History:  Procedure Laterality Date  . BACK SURGERY     ruptured disc  . PROSTATE BIOPSY N/A 01/20/2014   Procedure: PROSTATE BIOPSY;  Surgeon: Marissa Nestle, MD;  Location: [facility];  Service: Urology;  Laterality: N/A;  . THYROIDECTOMY      Body mass index is 31.85 kg/m.   No Known Allergies   Current Outpatient Medications:  .  alfuzosin (UROXATRAL) 10 MG 24 hr tablet, Take 10 mg by mouth every evening. , Disp: , Rfl:  .  amLODipine (NORVASC) 10 MG tablet, Take 10 mg by mouth daily., Disp: , Rfl:  .  aspirin 81 MG tablet, Take 81 mg by mouth daily., Disp: , Rfl:  .  atorvastatin (LIPITOR) 80 MG tablet, , Disp: , Rfl:  .  diclofenac (CATAFLAM) 50 MG tablet, TAKE 1 TABLET (50 MG TOTAL) BY MOUTH 3 (THREE) TIMES DAILY., Disp: 90 tablet, Rfl: 5 .  finasteride (PROSCAR) 5 MG tablet, Take 5 mg by mouth every evening. , Disp: , Rfl:  .  irbesartan (AVAPRO) 150 MG tablet, Take 150 mg by mouth daily., Disp: , Rfl:  .  JARDIANCE 10 MG TABS tablet, Take 10 mg by mouth daily., Disp: , Rfl:  .  LANTUS SOLOSTAR 100 UNIT/ML Solostar Pen, , Disp: , Rfl:  .  lisinopril (ZESTRIL) 40  MG tablet, Take 40 mg by mouth daily., Disp: , Rfl:  .  metFORMIN (GLUCOPHAGE) 1000 MG tablet, Take 1,000 mg by mouth 2 (two) times daily with a meal. , Disp: , Rfl:  .  traMADol-acetaminophen (ULTRACET) 37.5-325 MG tablet, TAKE 1 TABLET BY MOUTH EVERY 4 HOURS AS NEEDED, Disp: 42 tablet, Rfl: 0 .  VICTOZA 18 MG/3ML SOPN, Inject 1.8 mg into the skin daily. , Disp: , Rfl:  .  insulin aspart (NOVOLOG) 100 UNIT/ML injection, Inject 8-12 Units into the skin 3 (three) times daily before meals. SLIDING SCALE, Disp: , Rfl:    PHYSICAL EXAM SECTION: 1) BP (!) 143/79   Pulse 100   Ht 5\' 10"  (1.778 m)   Wt 222 lb (100.7 kg)   BMI 31.85 kg/m   Body mass index is 31.85 kg/m. General appearance: Well-developed well-nourished no gross deformities  2) Cardiovascular normal pulse and perfusion in the lower extremities normal color without edema  3) Neurologically deep tendon reflexes are equal and normal, no sensation loss or deficits no pathologic reflexes  4) Psychological: Awake alert and oriented x3 mood and affect normal  5) Skin no lacerations or ulcerations no nodularity no palpable masses, no erythema or nodularity  6) Musculoskeletal:   Varus alignment to the right knee with tenderness over the medial joint line knee  flexion is remaining above 100 degrees ligaments feel stable.  Small knee joint effusion is noted.  Skin is intact    MEDICAL DECISION SECTION:  Encounter Diagnosis  Name Primary?  . Primary osteoarthritis of right knee Yes    Imaging X-rays show varus deformity and joint space narrowing medial compartment right knee with mild secondary bone changes  Plan:  (Rx., Inj., surg., Frx, MRI/CT, XR:2)   28-month follow-up  Inject right knee  Continue medications as ordered  Procedure note right knee injection   verbal consent was obtained to inject right knee joint  Timeout was completed to confirm the site of injection  The medications used were 40 mg of  Depo-Medrol and 1% lidocaine 3 cc  Anesthesia was provided by ethyl chloride and the skin was prepped with alcohol.  After cleaning the skin with alcohol a 20-gauge needle was used to inject the right knee joint. There were no complications. A sterile bandage was applied.   12:00 PM Fuller Canada, MD  08/18/2019

## 2019-08-18 NOTE — Patient Instructions (Signed)

## 2019-08-21 ENCOUNTER — Other Ambulatory Visit: Payer: Self-pay | Admitting: Orthopedic Surgery

## 2019-08-21 DIAGNOSIS — M1711 Unilateral primary osteoarthritis, right knee: Secondary | ICD-10-CM

## 2019-08-29 ENCOUNTER — Other Ambulatory Visit: Payer: Self-pay | Admitting: Orthopedic Surgery

## 2019-08-29 DIAGNOSIS — M1711 Unilateral primary osteoarthritis, right knee: Secondary | ICD-10-CM

## 2019-09-01 ENCOUNTER — Ambulatory Visit
Admission: EM | Admit: 2019-09-01 | Discharge: 2019-09-01 | Disposition: A | Discharge status: Home / Self Care | Payer: 59 | Attending: Emergency Medicine | Admitting: Emergency Medicine

## 2019-09-01 DIAGNOSIS — Z20822 Contact with and (suspected) exposure to covid-19: Secondary | ICD-10-CM

## 2019-09-01 NOTE — ED Provider Notes (Signed)
[facility]    CSN: [id number] Arrival date & time: 09/01/19  1154      History   Chief Complaint Chief Complaint  Patient presents with  . Fever    HPI KALAI BACA is a 69 y.o. male.   Danzel Gullick 69 years old male presented to the urgent care for complaint of fever and chills for the past 3 to 5 days.  Reported wife tested positive for COVID-19 and currently hospitalized.  Denies sick exposure to flu or strep.  Denies recent travel.  Denies aggravating or alleviating symptoms.  Denies previous COVID infection.   Denies fever, chills, fatigue, nasal congestion, rhinorrhea, sore throat, cough, SOB, wheezing, chest pain, nausea, vomiting, changes in bowel or bladder habits.    The history is provided by the patient. No language interpreter was used.  Fever Associated symptoms: chills     Past Medical History:  Diagnosis Date  . Diabetes mellitus without complication (HCC)   . Hypercholesteremia   . Hypertension   . Shortness of breath     Patient Active Problem List   Diagnosis Date Noted  . Primary osteoarthritis of right knee 06/11/2014    Past Surgical History:  Procedure Laterality Date  . BACK SURGERY     ruptured disc  . PROSTATE BIOPSY N/A 01/20/2014   Procedure: PROSTATE BIOPSY;  Surgeon: Ky Barban, MD;  Location: [facility];  Service: Urology;  Laterality: N/A;  . THYROIDECTOMY         Home Medications    Prior to Admission medications   Medication Sig Start Date End Date Taking? Authorizing Provider  alfuzosin (UROXATRAL) 10 MG 24 hr tablet Take 10 mg by mouth every evening.     [provider]  amLODipine (NORVASC) 10 MG tablet Take 10 mg by mouth daily. 07/01/19   [provider]  aspirin 81 MG tablet Take 81 mg by mouth daily.    [provider]  atorvastatin (LIPITOR) 80 MG tablet  05/19/19   [provider]  diclofenac (CATAFLAM) 50 MG tablet TAKE 1 TABLET (50 MG TOTAL) BY MOUTH 3 (THREE)  TIMES DAILY. 07/16/18   Vickki Hearing, MD  finasteride (PROSCAR) 5 MG tablet Take 5 mg by mouth every evening.     [provider]  insulin aspart (NOVOLOG) 100 UNIT/ML injection Inject 8-12 Units into the skin 3 (three) times daily before meals. SLIDING SCALE    [provider]  irbesartan (AVAPRO) 150 MG tablet Take 150 mg by mouth daily.    [provider]  JARDIANCE 10 MG TABS tablet Take 10 mg by mouth daily. 06/19/19   [provider]  LANTUS SOLOSTAR 100 UNIT/ML Solostar Pen  08/01/19   [provider]  lisinopril (ZESTRIL) 40 MG tablet Take 40 mg by mouth daily. 06/12/19   [provider]  metFORMIN (GLUCOPHAGE) 1000 MG tablet Take 1,000 mg by mouth 2 (two) times daily with a meal.  04/10/16   [provider]  traMADol (ULTRAM) 50 MG tablet TAKE 1 TABLET (50 MG TOTAL) BY MOUTH EVERY 6 (SIX) HOURS AS NEEDED. 08/29/19   Vickki Hearing, MD  traMADol-acetaminophen (ULTRACET) 37.5-325 MG tablet TAKE 1 TABLET BY MOUTH EVERY 4 HOURS AS NEEDED 07/15/19   Vickki Hearing, MD  VICTOZA 18 MG/3ML SOPN Inject 1.8 mg into the skin daily.  05/17/16   [provider]    Family History Family History  Family history unknown: Yes    Social  History Social History   Tobacco Use  . Smoking status: Former Smoker    Packs/day: 1.00    Years: 30.00    Pack years: 30.00    Types: Cigarettes    Quit date: 01/14/2004    Years since quitting: 15.6  . Smokeless tobacco: Never Used  Substance Use Topics  . Alcohol use: No  . Drug use: No     Allergies   Patient has no known allergies.   Review of Systems Review of Systems  Constitutional: Positive for chills, fatigue and fever.  HENT: Negative.   Respiratory: Negative.   Cardiovascular: Negative.   Gastrointestinal: Negative.   Neurological: Negative.      Physical Exam Triage Vital Signs ED Triage Vitals  Enc Vitals Group     BP 09/01/19 1201 115/72      Pulse Rate 09/01/19 1201 (!) 110     Resp 09/01/19 1201 16     Temp 09/01/19 1201 99.3 F (37.4 C)     Temp Source 09/01/19 1201 Oral     SpO2 09/01/19 1201 95 %     Weight --      Height --      Head Circumference --      Peak Flow --      Pain Score 09/01/19 1212 0     Pain Loc --      Pain Edu? --      Excl. in GC? --    No data found.  Updated Vital Signs BP 115/72 (BP Location: Right Arm)   Pulse (!) 110   Temp 99.3 F (37.4 C) (Oral)   Resp 16   SpO2 95%   Visual Acuity Right Eye Distance:   Left Eye Distance:   Bilateral Distance:    Right Eye Near:   Left Eye Near:    Bilateral Near:     Physical Exam Vitals and nursing note reviewed.  Constitutional:      General: He is not in acute distress.    Appearance: Normal appearance. He is normal weight. He is not ill-appearing or toxic-appearing.  HENT:     Head: Normocephalic.     Right Ear: Tympanic membrane, ear canal and external ear normal. There is no impacted cerumen.     Left Ear: Tympanic membrane, ear canal and external ear normal. There is no impacted cerumen.     Nose: Nose normal. No congestion.     Mouth/Throat:     Mouth: Mucous membranes are moist.     Pharynx: No oropharyngeal exudate or posterior oropharyngeal erythema.  Cardiovascular:     Rate and Rhythm: Normal rate and regular rhythm.     Pulses: Normal pulses.     Heart sounds: Normal heart sounds. No murmur.  Pulmonary:     Effort: Pulmonary effort is normal. No respiratory distress.     Breath sounds: Normal breath sounds. No wheezing or rhonchi.  Chest:     Chest wall: No tenderness.  Abdominal:     General: Abdomen is flat. Bowel sounds are normal. There is no distension.     Palpations: There is no mass.  Skin:    Capillary Refill: Capillary refill takes less than 2 seconds.  Neurological:     Mental Status: He is alert and oriented to person, place, and time.      UC Treatments / Results  Labs (all labs ordered are  listed, but only abnormal results are displayed) Labs Reviewed  NOVEL CORONAVIRUS, NAA  EKG   Radiology No results found.  Procedures Procedures (including critical care time)  Medications Ordered in UC Medications - No data to display  Initial Impression / Assessment and Plan / UC Course  I have reviewed the triage vital signs and the nursing notes.  Pertinent labs & imaging results that were available during my care of the patient were reviewed by me and considered in my medical decision making (see chart for details).    COVID-19 test was ordered.  Patient is stable and in no acute distress.  Advised patient to quarantine until COVID-19 test result become available.  To go to ED for worsening of symptoms.  Patient verbalized understanding of the plan of care.  Final Clinical Impressions(s) / UC Diagnoses   Final diagnoses:  Suspected COVID-19 virus infection     Discharge Instructions     COVID testing ordered.  It will take between 2-7 days for test results.  Someone will contact you regarding abnormal results.    In the meantime: You should remain isolated in your home for 10 days from symptom onset AND greater than 72 hours after symptoms resolution (absence of fever without the use of fever-reducing medication and improvement in respiratory symptoms), whichever is longer Get plenty of rest and push fluids Use medications daily for symptom relief Use OTC medications like ibuprofen or tylenol as needed fever or pain Call or go to the ED if you have any new or worsening symptoms such as fever, worsening cough, shortness of breath, chest tightness, chest pain, turning blue, changes in mental status, etc...     ED Prescriptions    None     PDMP not reviewed this encounter.   Emerson Monte, [place] 09/01/19 1224

## 2019-09-01 NOTE — Discharge Instructions (Addendum)

## 2019-09-01 NOTE — ED Triage Notes (Signed)
pts wife hospitalized with covid last week, pt developing symptoms

## 2019-09-02 LAB — NOVEL CORONAVIRUS, NAA: SARS-CoV-2, NAA: DETECTED — AB

## 2019-09-03 ENCOUNTER — Other Ambulatory Visit: Payer: Self-pay | Admitting: Nurse Practitioner

## 2019-09-03 ENCOUNTER — Ambulatory Visit (HOSPITAL_COMMUNITY): Payer: 59

## 2019-09-03 DIAGNOSIS — U071 COVID-19: Secondary | ICD-10-CM

## 2019-09-03 NOTE — Progress Notes (Signed)
  I connected by phone with Chris Wade on 09/03/2019 at 11:02 AM to discuss the potential use of an new treatment for mild to moderate COVID-19 viral infection in non-hospitalized patients.  This patient is a 69 y.o. male that meets the FDA criteria for Emergency Use Authorization of bamlanivimab or casirivimab\imdevimab.  Has a (+) direct SARS-CoV-2 viral test result  Has mild or moderate COVID-19   Is ? 69 years of age and weighs ? 40 kg  Is NOT hospitalized due to COVID-19  Is NOT requiring oxygen therapy or requiring an increase in baseline oxygen flow rate due to COVID-19  Is within 10 days of symptom onset  Has at least one of the high risk factor(s) for progression to severe COVID-19 and/or hospitalization as defined in EUA.  Specific high risk criteria : >/= 67 yo   I have spoken and communicated the following to the patient or parent/caregiver:  1. FDA has authorized the emergency use of bamlanivimab and casirivimab\imdevimab for the treatment of mild to moderate COVID-19 in adults and pediatric patients with positive results of direct SARS-CoV-2 viral testing who are 71 years of age and older weighing at least 40 kg, and who are at high risk for progressing to severe COVID-19 and/or hospitalization.  2. The significant known and potential risks and benefits of bamlanivimab and casirivimab\imdevimab, and the extent to which such potential risks and benefits are unknown.  3. Information on available alternative treatments and the risks and benefits of those alternatives, including clinical trials.  4. Patients treated with bamlanivimab and casirivimab\imdevimab should continue to self-isolate and use infection control measures (e.g., wear mask, isolate, social distance, avoid sharing personal items, clean and disinfect "high touch" surfaces, and frequent handwashing) according to CDC guidelines.   5. The patient or parent/caregiver has the option to accept or refuse  bamlanivimab or casirivimab\imdevimab .  After reviewing this information with the patient, The patient agreed to proceed with receiving the bamlanimivab infusion and will be provided a copy of the Fact sheet prior to receiving the infusion.[name] 09/03/2019 11:02 AM

## 2019-09-05 ENCOUNTER — Ambulatory Visit (HOSPITAL_COMMUNITY)
Admission: RE | Admit: 2019-09-05 | Discharge: 2019-09-05 | Disposition: A | Discharge status: Home / Self Care | Payer: Medicare Other | Source: Ambulatory Visit | Attending: Pulmonary Disease | Admitting: Pulmonary Disease

## 2019-09-05 DIAGNOSIS — U071 COVID-19: Secondary | ICD-10-CM | POA: Insufficient documentation

## 2019-09-05 DIAGNOSIS — Z23 Encounter for immunization: Secondary | ICD-10-CM | POA: Insufficient documentation

## 2019-09-05 MED ORDER — EPINEPHRINE 0.3 MG/0.3ML IJ SOAJ: 0.3000 mg | Freq: Once | INTRAMUSCULAR | Status: DC | PRN

## 2019-09-05 MED ORDER — FAMOTIDINE IN NACL 20-0.9 MG/50ML-% IV SOLN: 20.0000 mg | Freq: Once | INTRAVENOUS | Status: DC | PRN

## 2019-09-05 MED ORDER — METHYLPREDNISOLONE SODIUM SUCC 125 MG IJ SOLR: 125.0000 mg | Freq: Once | INTRAMUSCULAR | Status: DC | PRN

## 2019-09-05 MED ORDER — SODIUM CHLORIDE 0.9 % IV SOLN
700.0000 mg | Freq: Once | INTRAVENOUS | Status: AC
  Administered 2019-09-05: 700 mg via INTRAVENOUS
  Filled 2019-09-05: qty 20

## 2019-09-05 MED ORDER — ALBUTEROL SULFATE HFA 108 (90 BASE) MCG/ACT IN AERS: 2.0000 | INHALATION_SPRAY | Freq: Once | RESPIRATORY_TRACT | Status: DC | PRN

## 2019-09-05 MED ORDER — DIPHENHYDRAMINE HCL 50 MG/ML IJ SOLN: 50.0000 mg | Freq: Once | INTRAMUSCULAR | Status: DC | PRN

## 2019-09-05 MED ORDER — SODIUM CHLORIDE 0.9 % IV SOLN
INTRAVENOUS | Status: DC | PRN
  Administered 2019-09-05: 250 mL via INTRAVENOUS

## 2019-09-05 NOTE — Progress Notes (Signed)
  Diagnosis: COVID-19  Physician: Wright  Procedure: [facility] Med: bamlanivimab infusion - Provided patient with bamlanimivab fact sheet for patients, parents and caregivers prior to infusion.  Complications: No immediate complications noted.  Discharge: Discharged home   Chris Wade 09/05/2019  

## 2019-09-05 NOTE — Discharge Instructions (Signed)

## 2019-10-04 ENCOUNTER — Other Ambulatory Visit: Payer: Self-pay | Admitting: Orthopedic Surgery

## 2019-10-04 DIAGNOSIS — M25561 Pain in right knee: Secondary | ICD-10-CM

## 2019-10-04 DIAGNOSIS — M1710 Unilateral primary osteoarthritis, unspecified knee: Secondary | ICD-10-CM

## 2019-11-05 ENCOUNTER — Other Ambulatory Visit: Payer: Self-pay | Admitting: Orthopedic Surgery

## 2019-11-05 DIAGNOSIS — M1711 Unilateral primary osteoarthritis, right knee: Secondary | ICD-10-CM

## 2020-02-06 ENCOUNTER — Ambulatory Visit: Payer: 59 | Admitting: Orthopedic Surgery

## 2020-02-16 ENCOUNTER — Ambulatory Visit (INDEPENDENT_AMBULATORY_CARE_PROVIDER_SITE_OTHER): Payer: 59 | Admitting: Orthopedic Surgery

## 2020-02-16 ENCOUNTER — Other Ambulatory Visit: Payer: Self-pay

## 2020-02-16 ENCOUNTER — Encounter: Payer: Self-pay | Admitting: Orthopedic Surgery

## 2020-02-16 ENCOUNTER — Ambulatory Visit: Payer: 59

## 2020-02-16 VITALS — BP 134/83 | HR 94 | Ht 69.0 in | Wt 213.0 lb

## 2020-02-16 DIAGNOSIS — M1711 Unilateral primary osteoarthritis, right knee: Secondary | ICD-10-CM | POA: Diagnosis not present

## 2020-02-16 DIAGNOSIS — M1710 Unilateral primary osteoarthritis, unspecified knee: Secondary | ICD-10-CM

## 2020-02-16 NOTE — Progress Notes (Signed)
Chief Complaint  Patient presents with  . Knee Pain    R/has been bothering me for the last 6 months or more; but today its doing pretty good today   69 year old male on tramadol and Cataflam 50 mg 3 times a day presents for follow-up on his right knee with history of osteoarthritis   C/o 8/10 pain   Past Medical History:  Diagnosis Date  . Diabetes mellitus without complication (HCC)   . Hypercholesteremia   . Hypertension   . Shortness of breath     BP 134/83   Pulse 94   Ht 5\' 9"  (1.753 m)   Wt 213 lb (96.6 kg)   BMI 31.45 kg/m    Physical Exam Vitals and nursing note reviewed.  Constitutional:      Appearance: Normal appearance.  Musculoskeletal:     Comments: Right knee 3-130 rom  Medial joint line tenderness no effusion  Stable  Motor 5/5   Neurological:     Mental Status: He is alert and oriented to person, place, and time.  Psychiatric:        Mood and Affect: Mood normal.       xrays no change since 07/2019  Encounter Diagnosis  Name Primary?  08/2019 Arthritis of knee, right Yes   Procedure note right knee injection   verbal consent was obtained to inject right knee joint  Timeout was completed to confirm the site of injection  The medications used were 40 mg of Depo-Medrol and 1% lidocaine 3 cc  Anesthesia was provided by ethyl chloride and the skin was prepped with alcohol.  After cleaning the skin with alcohol a 20-gauge needle was used to inject the right knee joint. There were no complications. A sterile bandage was applied.

## 2020-03-10 ENCOUNTER — Other Ambulatory Visit: Payer: Self-pay | Admitting: Orthopedic Surgery

## 2020-03-10 DIAGNOSIS — M1711 Unilateral primary osteoarthritis, right knee: Secondary | ICD-10-CM

## 2020-04-15 ENCOUNTER — Other Ambulatory Visit: Payer: Self-pay | Admitting: Orthopedic Surgery

## 2020-04-15 DIAGNOSIS — M1711 Unilateral primary osteoarthritis, right knee: Secondary | ICD-10-CM

## 2020-04-15 NOTE — Telephone Encounter (Signed)
Rx request, please advise. °

## 2020-06-10 ENCOUNTER — Other Ambulatory Visit: Payer: Self-pay | Admitting: Orthopedic Surgery

## 2020-06-10 DIAGNOSIS — M1711 Unilateral primary osteoarthritis, right knee: Secondary | ICD-10-CM

## 2020-07-26 ENCOUNTER — Other Ambulatory Visit: Payer: Self-pay | Admitting: Orthopedic Surgery

## 2020-07-26 DIAGNOSIS — M1711 Unilateral primary osteoarthritis, right knee: Secondary | ICD-10-CM

## 2020-08-09 ENCOUNTER — Ambulatory Visit
Admission: EM | Admit: 2020-08-09 | Discharge: 2020-08-09 | Disposition: A | Discharge status: Home / Self Care | Payer: 59 | Attending: Urgent Care | Admitting: Urgent Care

## 2020-08-09 ENCOUNTER — Other Ambulatory Visit: Payer: Self-pay

## 2020-08-09 DIAGNOSIS — J069 Acute upper respiratory infection, unspecified: Secondary | ICD-10-CM

## 2020-08-09 DIAGNOSIS — Z1152 Encounter for screening for COVID-19: Secondary | ICD-10-CM

## 2020-08-09 DIAGNOSIS — R0981 Nasal congestion: Secondary | ICD-10-CM

## 2020-08-09 MED ORDER — PROMETHAZINE-DM 6.25-15 MG/5ML PO SYRP: 5.0000 mL | ORAL_SOLUTION | Freq: Every evening | ORAL | 0 refills | Status: DC | PRN

## 2020-08-09 MED ORDER — PSEUDOEPHEDRINE HCL 60 MG PO TABS: 60.0000 mg | ORAL_TABLET | Freq: Three times a day (TID) | ORAL | 0 refills | Status: DC | PRN

## 2020-08-09 MED ORDER — BENZONATATE 100 MG PO CAPS: 100.0000 mg | ORAL_CAPSULE | Freq: Three times a day (TID) | ORAL | 0 refills | Status: DC | PRN

## 2020-08-09 MED ORDER — CETIRIZINE HCL 10 MG PO TABS: 10.0000 mg | ORAL_TABLET | Freq: Every day | ORAL | 0 refills | Status: DC

## 2020-08-09 NOTE — ED Provider Notes (Signed)
[facility]   MRN: [id number] DOB: [birth year]  Subjective:   Chris Wade is a 69 y.o. male presenting for 1 week history of persistent sinus congestion and coughing.  Patient had COVID-19 earlier this year, states that he has had persistent shortness of breath since then that is unchanged.  Denies fever, chest pain, ear pain, body aches.  He would like to make sure that he gets a flu test.  Denies smoking.  He does have a history of diabetes and tries to keep his blood sugars between 120-160, last check was 126 this morning.  No current facility-administered medications for this encounter.  Current Outpatient Medications:  .  alfuzosin (UROXATRAL) 10 MG 24 hr tablet, Take 10 mg by mouth every evening. , Disp: , Rfl:  .  amLODipine (NORVASC) 10 MG tablet, Take 10 mg by mouth daily., Disp: , Rfl:  .  aspirin 81 MG tablet, Take 81 mg by mouth daily., Disp: , Rfl:  .  atorvastatin (LIPITOR) 80 MG tablet, , Disp: , Rfl:  .  diclofenac (CATAFLAM) 50 MG tablet, TAKE 1 TABLET (50 MG TOTAL) BY MOUTH 3 (THREE) TIMES DAILY., Disp: 90 tablet, Rfl: 5 .  finasteride (PROSCAR) 5 MG tablet, Take 5 mg by mouth every evening. , Disp: , Rfl:  .  insulin aspart (NOVOLOG) 100 UNIT/ML injection, Inject 8-12 Units into the skin 3 (three) times daily before meals. SLIDING SCALE, Disp: , Rfl:  .  irbesartan (AVAPRO) 150 MG tablet, Take 150 mg by mouth daily., Disp: , Rfl:  .  JARDIANCE 10 MG TABS tablet, Take 10 mg by mouth daily., Disp: , Rfl:  .  LANTUS SOLOSTAR 100 UNIT/ML Solostar Pen, , Disp: , Rfl:  .  lisinopril (ZESTRIL) 40 MG tablet, Take 40 mg by mouth daily., Disp: , Rfl:  .  metFORMIN (GLUCOPHAGE) 1000 MG tablet, Take 1,000 mg by mouth 2 (two) times daily with a meal. , Disp: , Rfl:  .  traMADol-acetaminophen (ULTRACET) 37.5-325 MG tablet, TAKE 1 TABLET BY MOUTH EVERY 4 HOURS AS NEEDED, Disp: 42 tablet, Rfl: 0 .  VICTOZA 18 MG/3ML SOPN, Inject 1.8 mg into the skin daily. , Disp: ,  Rfl:    No Known Allergies  Past Medical History:  Diagnosis Date  . Diabetes mellitus without complication (HCC)   . Hypercholesteremia   . Hypertension   . Shortness of breath      Past Surgical History:  Procedure Laterality Date  . BACK SURGERY     ruptured disc  . PROSTATE BIOPSY N/A 01/20/2014   Procedure: PROSTATE BIOPSY;  Surgeon: Ky Barban, MD;  Location: [facility];  Service: Urology;  Laterality: N/A;  . THYROIDECTOMY      Family History  Family history unknown: Yes    Social History   Tobacco Use  . Smoking status: Former Smoker    Packs/day: 1.00    Years: 30.00    Pack years: 30.00    Types: Cigarettes    Quit date: 01/14/2004    Years since quitting: 16.5  . Smokeless tobacco: Never Used  Substance Use Topics  . Alcohol use: No  . Drug use: No    ROS   Objective:   Vitals: BP (!) 145/74 (BP Location: Right Arm)   Pulse 98   Temp 98.4 F (36.9 C) (Oral)   Resp 16   SpO2 96%   Physical Exam Constitutional:      General: He is not in acute distress.  Appearance: Normal appearance. He is well-developed. He is not ill-appearing, toxic-appearing or diaphoretic.  HENT:     Head: Normocephalic and atraumatic.     Right Ear: External ear normal.     Left Ear: External ear normal.     Nose: Nose normal.     Mouth/Throat:     Mouth: Mucous membranes are moist.     Pharynx: Oropharynx is clear.  Eyes:     General: No scleral icterus.    Extraocular Movements: Extraocular movements intact.     Pupils: Pupils are equal, round, and reactive to light.  Cardiovascular:     Rate and Rhythm: Normal rate and regular rhythm.     Heart sounds: Normal heart sounds. No murmur heard. No friction rub. No gallop.   Pulmonary:     Effort: Pulmonary effort is normal. No respiratory distress.     Breath sounds: Normal breath sounds. No stridor. No wheezing, rhonchi or rales.  Neurological:     Mental Status: He is alert and oriented to person, place,  and time.  Psychiatric:        Mood and Affect: Mood normal.        Behavior: Behavior normal.        Thought Content: Thought content normal.      Assessment and Plan :   PDMP not reviewed this encounter.  1. Viral URI with cough   2. Encounter for screening for COVID-19   3. Nasal congestion     Will manage for viral illness such as viral URI, viral syndrome, viral rhinitis, COVID-19, influenza. Counseled patient on nature of COVID-19 including modes of transmission, diagnostic testing, management and supportive care.  Offered scripts for symptomatic relief. COVID 19 testing is pending.  Patient has very reassuring physical exam findings, vital signs, deferred imaging for the chest as he has had more chronic shortness of breath and has clear cardiopulmonary exam, 97% pulse oximetry and no chest pain.  Counseled patient on potential for adverse effects with medications prescribed/recommended today, ER and return-to-clinic precautions discussed, patient verbalized understanding.     Wallis Bamberg, [place] 08/09/20 1209

## 2020-08-09 NOTE — ED Triage Notes (Signed)
Pt presents with nasal congestion and some cough for almost a week, wants to be tested for flu

## 2020-08-09 NOTE — Discharge Instructions (Signed)

## 2020-08-13 LAB — COVID-19, FLU A+B NAA
Influenza A, NAA: NOT DETECTED
Influenza B, NAA: NOT DETECTED
SARS-CoV-2, NAA: NOT DETECTED

## 2020-08-18 ENCOUNTER — Encounter: Payer: Self-pay | Admitting: Orthopedic Surgery

## 2020-08-18 ENCOUNTER — Ambulatory Visit: Payer: 59

## 2020-08-18 ENCOUNTER — Ambulatory Visit (INDEPENDENT_AMBULATORY_CARE_PROVIDER_SITE_OTHER): Payer: 59 | Admitting: Orthopedic Surgery

## 2020-08-18 ENCOUNTER — Other Ambulatory Visit (HOSPITAL_COMMUNITY): Payer: Self-pay | Admitting: Adult Health

## 2020-08-18 ENCOUNTER — Other Ambulatory Visit: Payer: Self-pay

## 2020-08-18 ENCOUNTER — Ambulatory Visit (HOSPITAL_COMMUNITY)
Admission: RE | Admit: 2020-08-18 | Discharge: 2020-08-18 | Disposition: A | Discharge status: Home / Self Care | Payer: Medicare Other | Source: Ambulatory Visit | Attending: Adult Health | Admitting: Adult Health

## 2020-08-18 VITALS — BP 133/79 | HR 104 | Ht 69.0 in | Wt 211.0 lb

## 2020-08-18 DIAGNOSIS — R0602 Shortness of breath: Secondary | ICD-10-CM

## 2020-08-18 DIAGNOSIS — M1711 Unilateral primary osteoarthritis, right knee: Secondary | ICD-10-CM

## 2020-08-18 DIAGNOSIS — M1710 Unilateral primary osteoarthritis, unspecified knee: Secondary | ICD-10-CM

## 2020-08-18 DIAGNOSIS — R051 Acute cough: Secondary | ICD-10-CM

## 2020-08-18 DIAGNOSIS — R509 Fever, unspecified: Secondary | ICD-10-CM | POA: Insufficient documentation

## 2020-08-18 DIAGNOSIS — M25561 Pain in right knee: Secondary | ICD-10-CM

## 2020-08-18 MED ORDER — TRAMADOL-ACETAMINOPHEN 37.5-325 MG PO TABS: 1.0000 | ORAL_TABLET | ORAL | 0 refills | Status: DC | PRN

## 2020-08-18 NOTE — Progress Notes (Signed)
Chief Complaint  Patient presents with  . Knee Pain    Chronic rt knee pain getting worse.      69 year old male with chronic pain in his right knee secondary to osteoarthritis presents for 68-month follow-up, current medications Ultracet and diclofenac also gets injections periodically every 6 months  He says he is finally ready for knee replacement he says he can no longer deal with the pain  Past Medical History:  Diagnosis Date  . Diabetes mellitus without complication (HCC)   . Hypercholesteremia   . Hypertension   . Shortness of breath    Past Surgical History:  Procedure Laterality Date  . BACK SURGERY     ruptured disc  . PROSTATE BIOPSY N/A 01/20/2014   Procedure: PROSTATE BIOPSY;  Surgeon: Ky Barban, MD;  Location: [facility];  Service: Urology;  Laterality: N/A;  . THYROIDECTOMY     Family History  Family history unknown: Yes   Social History   Tobacco Use  . Smoking status: Former Smoker    Packs/day: 1.00    Years: 30.00    Pack years: 30.00    Types: Cigarettes    Quit date: 01/14/2004    Years since quitting: 16.6  . Smokeless tobacco: Never Used  Substance Use Topics  . Alcohol use: No  . Drug use: No   No Known Allergies  Current Outpatient Medications:  .  alfuzosin (UROXATRAL) 10 MG 24 hr tablet, Take 10 mg by mouth every evening. , Disp: , Rfl:  .  amLODipine (NORVASC) 10 MG tablet, Take 10 mg by mouth daily., Disp: , Rfl:  .  aspirin 81 MG tablet, Take 81 mg by mouth daily., Disp: , Rfl:  .  atorvastatin (LIPITOR) 80 MG tablet, , Disp: , Rfl:  .  benzonatate (TESSALON) 100 MG capsule, Take 1-2 capsules (100-200 mg total) by mouth 3 (three) times daily as needed for cough., Disp: 60 capsule, Rfl: 0 .  cetirizine (ZYRTEC ALLERGY) 10 MG tablet, Take 1 tablet (10 mg total) by mouth daily., Disp: 30 tablet, Rfl: 0 .  diclofenac (CATAFLAM) 50 MG tablet, TAKE 1 TABLET (50 MG TOTAL) BY MOUTH 3 (THREE) TIMES DAILY., Disp: 90 tablet, Rfl: 5 .   finasteride (PROSCAR) 5 MG tablet, Take 5 mg by mouth every evening. , Disp: , Rfl:  .  irbesartan (AVAPRO) 150 MG tablet, Take 150 mg by mouth daily., Disp: , Rfl:  .  JARDIANCE 10 MG TABS tablet, Take 10 mg by mouth daily., Disp: , Rfl:  .  LANTUS SOLOSTAR 100 UNIT/ML Solostar Pen, , Disp: , Rfl:  .  lisinopril (ZESTRIL) 40 MG tablet, Take 40 mg by mouth daily., Disp: , Rfl:  .  metFORMIN (GLUCOPHAGE) 1000 MG tablet, Take 1,000 mg by mouth 2 (two) times daily with a meal. , Disp: , Rfl:  .  promethazine-dextromethorphan (PROMETHAZINE-DM) 6.25-15 MG/5ML syrup, Take 5 mLs by mouth at bedtime as needed for cough., Disp: 100 mL, Rfl: 0 .  pseudoephedrine (SUDAFED) 60 MG tablet, Take 1 tablet (60 mg total) by mouth every 8 (eight) hours as needed for congestion., Disp: 30 tablet, Rfl: 0 .  insulin aspart (NOVOLOG) 100 UNIT/ML injection, Inject 8-12 Units into the skin 3 (three) times daily before meals. SLIDING SCALE (Patient not taking: Reported on 08/18/2020), Disp: , Rfl:  .  traMADol-acetaminophen (ULTRACET) 37.5-325 MG tablet, Take 1 tablet by mouth every 4 (four) hours as needed., Disp: 42 tablet, Rfl: 0 .  VICTOZA 18 MG/3ML SOPN, Inject  1.8 mg into the skin daily.  (Patient not taking: Reported on 08/18/2020), Disp: , Rfl:   BP 133/79   Pulse (!) 104   Ht 5\' 9"  (1.753 m)   Wt 211 lb (95.7 kg)   BMI 31.16 kg/m   He is awake alert and oriented x3 mood and affect are normal he has no gross abnormalities he is walking without a limp or cane  His right knee comes to full extension has a severe bow is flexion is 125 degrees ligaments are stable has tenderness over the medial compartment and patellofemoral joint with some crepitance   X-rays show a mild varus deformity less than 10 degrees with severe cartilage loss medial compartment   Right total knee  The procedure has been fully reviewed with the patient; The risks and benefits of surgery have been discussed and explained and  understood. Alternative treatment has also been reviewed, questions were encouraged and answered. The postoperative plan is also been reviewed.   Meds ordered this encounter  Medications  . traMADol-acetaminophen (ULTRACET) 37.5-325 MG tablet    Sig: Take 1 tablet by mouth every 4 (four) hours as needed.    Dispense:  42 tablet    Refill:  0    Not to exceed 5 additional fills before 11/17/2019

## 2020-08-18 NOTE — Patient Instructions (Addendum)
You have decided to proceed with knee replacement surgery. You have decided not to continue with nonoperative measures such as but not limited to oral medication, weight loss, activity modification, physical therapy, bracing, or injection.  We will perform the procedure commonly known as total knee replacement. Some of the risks associated with knee replacement surgery include but are not limited to Bleeding Infection Swelling Stiffness Blood clot Pulmonary embolism  Loosening of the implant Pain that persists even after surgery  Infection is especially devastating complication of knee surgery although rare. If infection does occur your implant will usually have to be removed and several surgeries and antibiotics will be needed to eradicate the infection prior to performing a repeat replacement.   In some cases amputation is required to eradicate the infection. In other rare cases a knee fusion is needed   In compliance with recent [place] law in federal regulation regarding opioid use and abuse and addiction, we will taper (stop) opioid medication after 2 weeks.  If you're not comfortable with these risks and would like to continue with nonoperative treatment please let Dr. Romeo Apple know prior to your surgery.     Total Knee Replacement  Total knee replacement is a procedure to replace the damaged knee joint with an artificial (prosthetic) knee joint. The purpose of this surgery is to reduce knee pain and improve knee function. The prosthetic knee joint (prosthesis) may be made of metal, plastic, or ceramic. It replaces parts of the thigh bone (femur), lower leg bone (tibia), and kneecap (patella) that are removed during the procedure. Tell a health care provider about:  Any allergies you have.  All medicines you are taking, including vitamins, herbs, eye drops, creams, and over-the-counter medicines.  Any problems you or family members have had with anesthetic  medicines.  Any blood disorders you have.  Any surgeries you have had.  Any medical conditions you have.  Whether you are pregnant or may be pregnant. What are the risks? Generally, this is a safe procedure. However, problems may occur, including:  Infection.  Bleeding.  Blood clot.  Allergic reactions to medicines.  Damage to nerves or other structures.  Decreased range of motion of the knee.  Instability of the knee.  Loosening of the prosthetic joint.  Knee pain that does not go away (chronic pain). What happens before the procedure? Staying hydrated Follow instructions from your health care provider about hydration, which may include:  Up to 2 hours before the procedure - you may continue to drink clear liquids, such as water, clear fruit juice, black coffee, and plain tea.  Eating and drinking restrictions Follow instructions from your health care provider about eating and drinking, which may include:  8 hours before the procedure - stop eating heavy meals or foods, such as meat, fried foods, or fatty foods.  6 hours before the procedure - stop eating light meals or foods, such as toast or cereal.  6 hours before the procedure - stop drinking milk or drinks that contain milk.  2 hours before the procedure - stop drinking clear liquids. Medicines Ask your health care provider about:  Changing or stopping your regular medicines. This is especially important if you are taking diabetes medicines or blood thinners.  Taking medicines such as aspirin and ibuprofen. These medicines can thin your blood. Do not take these medicines unless your health care provider tells you to take them.  Taking over-the-counter medicines, vitamins, herbs, and supplements. Tests and exams  You may have  a physical exam.  You may have tests, such as: ? X-rays. ? MRI. ? CT scan. ? Bone scans.  You may have a blood or urine sample taken. Lifestyle   If your health care  provider prescribes physical therapy, do exercises as instructed.  Maintain good body and oral hygiene. Germs from anywhere in your body can travel to your new joint and infect it. Tell your health care provider if you: ? Plan to have dental care and routine cleanings. ? Develop any skin infections.  If you are overweight, work with your health care provider to reach a safe weight. Extra weight can put pressure on your knee.  Do not use any products that contain nicotine or tobacco, such as cigarettes, e-cigarettes, and chewing tobacco. These can delay healing after surgery. If you need help quitting, ask your health care provider. General instructions  Plan to have someone take you home from the hospital or clinic.  Plan to have a responsible adult care for you for at least 24 hours after you leave the hospital or clinic. This is important. It is recommended that you have someone to help care for you for at least 4-6 weeks after your procedure.  Do not shave your legs just before surgery. If hair removal is needed, it will be done in the hospital.  Ask your health care provider how your surgical site will be marked or identified.  Ask your health care provider what steps will be taken to prevent infection. These may include: ? Removing hair at the surgery site. ? Washing skin with a germ-killing soap. What happens during the procedure?  An IV will be inserted into one of your veins.  You will be given one or more of the following: ? A medicine to help you relax (sedative). ? A medicine that is injected into an area of your body near the nerves to numb everything below the injection site (peripheral nerve block). ? A medicine that is injected into your spine to numb the area below and slightly above the injection site (spinal anesthetic). ? A medicine to make you fall asleep (general anesthetic).  An incision will be made in your knee.  Damaged cartilage and bone will be removed  from your femur, tibia, and patella.  Parts of the prosthesis (liners) will be placed over the areas of bone and cartilage that were removed. A metal liner will be placed over your femur, and plastic liners will be placed over your tibia and the underside of your patella.  One or more small tubes (drains) may be placed near your incision to help drain extra fluid from your surgery site.  Your incision will be closed with stitches (sutures), skin glue, or adhesive strips.  A bandage (dressing) will be placed over your incision. The procedure may vary among health care providers and hospitals. What happens after the procedure?  Your blood pressure, heart rate, breathing rate, and blood oxygen level will be monitored until you leave the hospital or clinic.  You will be given medicines to help manage pain.  You may: ? Continue to receive fluids through an IV. ? Have fluid coming from one or more drains in your incision. ? Have to wear compression stockings. These stockings help to prevent blood clots and reduce swelling in your legs. ? Be given a continuous passive motion machine to use. You will be shown how to use this machine.  You will be encouraged to move. A physical therapist will teach you  how to use crutches or a walker. He or she will also teach you how to exercise at home.  Do not drive until your health care provider approves. Summary  Total knee replacement is a procedure to replace the knee joint with an artificial (prosthetic) knee joint.  Before the procedure, follow instructions from your health care provider about eating and drinking.  Plan to have someone take you home from the hospital or clinic. This information is not intended to replace advice given to you by your health care provider. Make sure you discuss any questions you have with your health care provider. Document Revised: 03/21/2018 Document Reviewed: 03/21/2018 Elsevier Patient Education  2020 [employer].

## 2020-08-18 NOTE — Addendum Note (Signed)
Addended byCaffie Damme on: 08/18/2020 10:29 AM   Modules accepted: Orders, SmartSet

## 2020-09-24 ENCOUNTER — Telehealth: Payer: Self-pay | Admitting: Radiology

## 2020-09-24 NOTE — Telephone Encounter (Signed)
Started the prior authorization request on [facility] [id number] is the pending auth number

## 2020-10-05 NOTE — Patient Instructions (Signed)
Chris Wade  10/05/2020     @PREFPERIOPPHARMACY @   Your procedure is scheduled on  10/12/2020.   Report to 10/14/2020 at  [phone]  A.M.   Call this number if you have problems the morning of surgery:  [phone]     Remember:  Do not eat or drink after midnight.                        Take these medicines the morning of surgery with A SIP OF WATER  Amlodipine.   Take 15 units of Lantus the night before your procedure.   DO NOT take any medications for diabetes the morning of your procedure.  If your glucose is 70 or below the morning of your procedure, drink 1/2 cup of clear juice and recheck your glucose in 15 minutes. If it is still 70 or below, call [phone] for instructions.  If your glucose is over 300 the morning of your procedure, call [phone] for instructions.      Do not wear jewelry, make-up or nail polish.  Do not wear lotions, powders, or perfumes, or deodorant.  Do not shave 48 hours prior to surgery.  Men may shave face and neck.  Do not bring valuables to the hospital.  [facility] is not responsible for any belongings or valuables.   Shower the night before and the morning of your procedure with CHG. DO NOT put CHG on your face, hair or genitals.   After your night shower, dry off with a clean towel, put on clean clothes to sleep in and place clean shhets on your bed before you sleep.  DO NOT sleep with pets this night.   After your morning shower, dry off with a clean towel put on clean, comfortable clothes and brush your teeth before you come to the hospital.    Contacts, dentures or bridgework may not be worn into surgery.  Leave your suitcase in the car.  After surgery it may be brought to your room.  For patients admitted to the hospital, discharge time will be determined by your treatment team.  Patients discharged the day of surgery will not be allowed to drive home and must have some one with them for 24  hours.   Special instructions:   DO NOT smoke tobacco or vape the morning of your procedure.     Please read over the following fact sheets that you were given. Pain Booklet, Coughing and Deep Breathing, Blood Transfusion Information, Lab Information, Total Joint Packet, Surgical Site Infection Prevention, Anesthesia Post-op Instructions and Care and Recovery After Surgery       Total Knee Replacement, Care After This sheet gives you information about how to care for yourself after your procedure. Your health care provider may also give you more specific instructions. If you have problems or questions, contact your health care provider. What can I expect after the procedure? After the procedure, it is common to have:  Redness, pain, and swelling at the incision area.  Stiffness.  Discomfort.  A small amount of blood or clear fluid coming from your incision. Follow these instructions at home: Medicines  Take over-the-counter and prescription medicines only as told by your health care provider.  If you were prescribed a blood thinner (anticoagulant), take it as told by your health care provider.  Ask your health care provider if the medicine prescribed to you: ? Requires you to avoid driving  or using machinery. ? Can cause constipation. You may need to take these actions to prevent or treat constipation:  Drink enough fluid to keep your urine pale yellow.  Take over-the-counter or prescription medicines.  Eat foods that are high in fiber, such as beans, whole grains, and fresh fruits and vegetables.  Limit foods that are high in fat and processed sugars, such as fried or sweet foods. Incision care  Follow instructions from your health care provider about how to take care of your incision. Make sure you: ? Wash your hands with soap and water for at least 20 seconds before and after you change your bandage (dressing). If soap and water are not available, use hand  sanitizer. ? Change your dressing as told by your health care provider. ? Leave stitches (sutures), staples, skin glue, or adhesive strips in place. These skin closures may need to stay in place for 2 weeks or longer. If adhesive strip edges start to loosen and curl up, you may trim the loose edges. Do not remove adhesive strips completely unless your health care provider tells you to do that.  Do not take baths, swim, or use a hot tub until your health care provider approves.  Check your incision area every day for signs of infection. Check for: ? More redness, swelling, or pain. ? More fluid or blood. ? Warmth. ? Pus or a bad smell.   Activity  Rest as told by your health care provider.  Avoid sitting for a long time without moving. Get up to take short walks every 1-2 hours. This is important to improve blood flow and breathing. Ask for help if you feel weak or unsteady.  Follow instructions from your health care provider about using a walker, crutches, or a cane. ? You may use your legs to support (bear) your body weight as told by your health care provider. Follow instructions about how much weight you may safely support on your affected leg (weight-bearing restrictions). ? A physical therapist may show you how to get out of a bed and chair and how to go up and down stairs. You will first do this with a walker, crutches, or a cane and then without any of these devices. ? Once you are able to walk without a limp, you may stop using a walker, crutches, or a cane.  Do exercises as told by your health care provider or physical therapist.  Avoid high-impact activities, including running, jumping rope, and doing jumping jacks.  Do not play contact sports until your health care provider approves.  Return to your normal activities as told by your health care provider. Ask your health care provider what activities are safe for you. Managing pain, stiffness, and swelling  If directed, put  ice on your knee. To do this: ? Put ice in a plastic bag or use the icing device (cold flow pad) that you were given. Follow instructions from your health care provider about how to use the icing device. ? Place a towel between your skin and the bag or between your skin and the icing device. ? Leave the ice on for 20 minutes, 2-3 times a day. ? Remove the ice if your skin turns bright red. This is very important. If you cannot feel pain, heat, or cold, you have a greater risk of damage to the area.  Move your toes often to reduce stiffness and swelling.  Raise (elevate) your leg above the level of your heart while you are  sitting or lying down. ? Use several pillows to keep your leg straight. ? Do not put a pillow just under the knee. If the knee is bent for a long time, this may lead to stiffness.  Wear elastic knee support as told by your health care provider.   Safety  To help prevent falls, keep floors clear of objects you may trip over. Place items that you may need within easy reach.  Wear an apron or tool belt with pockets for carrying objects. This leaves your hands free to help with your balance.  Ask your health care provider when it is safe to drive.   General instructions  Wear compression stockings as told by your health care provider. These stockings help to prevent blood clots and reduce swelling in your legs.  Continue with breathing exercises. This helps prevent lung infection.  Do not use any products that contain nicotine or tobacco. These products include cigarettes, chewing tobacco, and vaping devices, such as e-cigarettes. These can delay healing after surgery. If you need help quitting, ask your health care provider.  Tell your health care provider if you plan to have dental work. Also: ? Tell your dentist about your joint replacement. ? Ask your health care provider if there are any special instructions you need to follow before having dental care and routine  cleanings.  Keep all follow-up visits. This is important. Contact a health care provider if:  You have a fever or chills.  You have a cough or feel short of breath.  Your medicine is not controlling your pain.  You have any of these signs of infection: ? More redness, swelling, or pain around your incision. ? More fluid or blood coming from your incision. ? Warmth coming from your incision. ? Pus or a bad smell coming from your incision.  You fall. Get help right away if:  You have severe pain.  You have trouble breathing.  You have chest pain.  You have redness, swelling, pain, or warmth in your calf or leg.  Your incision breaks open after sutures or staples are removed. These symptoms may represent a serious problem that is an emergency. Do not wait to see if the symptoms will go away. Get medical help right away. Call your local emergency services (911 in the [place] Do not drive yourself to the hospital. Summary  After the procedure, it is common to have pain and swelling at the incision area, a small amount of blood or fluid coming from your incision, and stiffness.  Follow instructions from your health care provider about how to take care of your incision.  Use crutches, a walker, or a cane as told by your health care provider. This information is not intended to replace advice given to you by your health care provider. Make sure you discuss any questions you have with your health care provider. Document Revised: 01/27/2020 Document Reviewed: 01/27/2020 Elsevier Patient Education  2021 [employer] General Anesthesia, Adult, Care After This sheet gives you information about how to care for yourself after your procedure. Your health care provider may also give you more specific instructions. If you have problems or questions, contact your health care provider. What can I expect after the procedure? After the procedure, the following side effects are common:  Pain  or discomfort at the IV site.  Nausea.  Vomiting.  Sore throat.  Trouble concentrating.  Feeling cold or chills.  Feeling weak or tired.  Sleepiness and fatigue.  Soreness and  body aches. These side effects can affect parts of the body that were not involved in surgery. Follow these instructions at home: For the time period you were told by your health care provider:  Rest.  Do not participate in activities where you could fall or become injured.  Do not drive or use machinery.  Do not drink alcohol.  Do not take sleeping pills or medicines that cause drowsiness.  Do not make important decisions or sign legal documents.  Do not take care of children on your own.   Eating and drinking  Follow any instructions from your health care provider about eating or drinking restrictions.  When you feel hungry, start by eating small amounts of foods that are soft and easy to digest (bland), such as toast. Gradually return to your regular diet.  Drink enough fluid to keep your urine pale yellow.  If you vomit, rehydrate by drinking water, juice, or clear broth. General instructions  If you have sleep apnea, surgery and certain medicines can increase your risk for breathing problems. Follow instructions from your health care provider about wearing your sleep device: ? Anytime you are sleeping, including during daytime naps. ? While taking prescription pain medicines, sleeping medicines, or medicines that make you drowsy.  Have a responsible adult stay with you for the time you are told. It is important to have someone help care for you until you are awake and alert.  Return to your normal activities as told by your health care provider. Ask your health care provider what activities are safe for you.  Take over-the-counter and prescription medicines only as told by your health care provider.  If you smoke, do not smoke without supervision.  Keep all follow-up visits as told  by your health care provider. This is important. Contact a health care provider if:  You have nausea or vomiting that does not get better with medicine.  You cannot eat or drink without vomiting.  You have pain that does not get better with medicine.  You are unable to pass urine.  You develop a skin rash.  You have a fever.  You have redness around your IV site that gets worse. Get help right away if:  You have difficulty breathing.  You have chest pain.  You have blood in your urine or stool, or you vomit blood. Summary  After the procedure, it is common to have a sore throat or nausea. It is also common to feel tired.  Have a responsible adult stay with you for the time you are told. It is important to have someone help care for you until you are awake and alert.  When you feel hungry, start by eating small amounts of foods that are soft and easy to digest (bland), such as toast. Gradually return to your regular diet.  Drink enough fluid to keep your urine pale yellow.  Return to your normal activities as told by your health care provider. Ask your health care provider what activities are safe for you. This information is not intended to replace advice given to you by your health care provider. Make sure you discuss any questions you have with your health care provider. Document Revised: 04/22/2020 Document Reviewed: 11/20/2019 Elsevier Patient Education  2021 [employer]

## 2020-10-06 ENCOUNTER — Other Ambulatory Visit: Payer: Self-pay | Admitting: Orthopedic Surgery

## 2020-10-06 DIAGNOSIS — M1711 Unilateral primary osteoarthritis, right knee: Secondary | ICD-10-CM

## 2020-10-07 ENCOUNTER — Other Ambulatory Visit: Payer: Self-pay

## 2020-10-07 ENCOUNTER — Encounter (HOSPITAL_COMMUNITY)
Admission: RE | Admit: 2020-10-07 | Discharge: 2020-10-07 | Disposition: A | Discharge status: Home / Self Care | Payer: Medicare Other | Source: Ambulatory Visit | Attending: Orthopedic Surgery | Admitting: Orthopedic Surgery

## 2020-10-07 ENCOUNTER — Encounter (HOSPITAL_COMMUNITY): Payer: Self-pay

## 2020-10-07 DIAGNOSIS — Z01818 Encounter for other preprocedural examination: Secondary | ICD-10-CM | POA: Insufficient documentation

## 2020-10-07 LAB — BASIC METABOLIC PANEL
Anion gap: 11 (ref 5–15)
BUN: 27 mg/dL — ABNORMAL HIGH (ref 8–23)
CO2: 22 mmol/L (ref 22–32)
Calcium: 10 mg/dL (ref 8.9–10.3)
Chloride: 105 mmol/L (ref 98–111)
Creatinine, Ser: 1.07 mg/dL (ref 0.61–1.24)
GFR, Estimated: >60 mL/min (ref >60)
Glucose, Bld: 145 mg/dL — ABNORMAL HIGH (ref 70–99)
Potassium: 4.1 mmol/L (ref 3.5–5.1)
Sodium: 138 mmol/L (ref 135–145)

## 2020-10-07 LAB — CBC WITH DIFFERENTIAL/PLATELET
Abs Immature Granulocytes: 0.01 10*3/uL (ref 0.00–0.07)
Basophils Absolute: 0.1 10*3/uL (ref 0.0–0.1)
Basophils Relative: 1 %
Eosinophils Absolute: 0.3 10*3/uL (ref 0.0–0.5)
Eosinophils Relative: 6 %
HCT: 43.3 % (ref 39.0–52.0)
Hemoglobin: 13.5 g/dL (ref 13.0–17.0)
Immature Granulocytes: 0 %
Lymphocytes Relative: 27 %
Lymphs Abs: 1.6 10*3/uL (ref 0.7–4.0)
MCH: 24.7 pg — ABNORMAL LOW (ref 26.0–34.0)
MCHC: 31.2 g/dL (ref 30.0–36.0)
MCV: 79.3 fL — ABNORMAL LOW (ref 80.0–100.0)
Monocytes Absolute: 0.5 10*3/uL (ref 0.1–1.0)
Monocytes Relative: 9 %
Neutro Abs: 3.3 10*3/uL (ref 1.7–7.7)
Neutrophils Relative %: 57 %
Platelets: 259 10*3/uL (ref 150–400)
RBC: 5.46 MIL/uL (ref 4.22–5.81)
RDW: 15.6 % — ABNORMAL HIGH (ref 11.5–15.5)
WBC: 5.8 10*3/uL (ref 4.0–10.5)
nRBC: 0 % (ref 0.0–0.2)

## 2020-10-07 LAB — PREPARE RBC (CROSSMATCH)

## 2020-10-07 LAB — HEMOGLOBIN A1C
Hgb A1c MFr Bld: 9.5 % — ABNORMAL HIGH (ref 4.8–5.6)
Mean Plasma Glucose: 225.95 mg/dL

## 2020-10-11 ENCOUNTER — Other Ambulatory Visit: Payer: Self-pay

## 2020-10-11 ENCOUNTER — Other Ambulatory Visit: Payer: Self-pay | Admitting: Orthopedic Surgery

## 2020-10-11 ENCOUNTER — Other Ambulatory Visit (HOSPITAL_COMMUNITY)
Admission: RE | Admit: 2020-10-11 | Discharge: 2020-10-11 | Disposition: A | Discharge status: Home / Self Care | Payer: Medicare Other | Source: Ambulatory Visit | Attending: Orthopedic Surgery | Admitting: Orthopedic Surgery

## 2020-10-11 DIAGNOSIS — Z01812 Encounter for preprocedural laboratory examination: Secondary | ICD-10-CM | POA: Diagnosis present

## 2020-10-11 DIAGNOSIS — M1710 Unilateral primary osteoarthritis, unspecified knee: Secondary | ICD-10-CM

## 2020-10-11 DIAGNOSIS — Z20822 Contact with and (suspected) exposure to covid-19: Secondary | ICD-10-CM | POA: Insufficient documentation

## 2020-10-11 DIAGNOSIS — M25561 Pain in right knee: Secondary | ICD-10-CM

## 2020-10-11 DIAGNOSIS — M1711 Unilateral primary osteoarthritis, right knee: Secondary | ICD-10-CM

## 2020-10-11 LAB — TYPE AND SCREEN
ABO/RH(D): A POS
Antibody Screen: NEGATIVE

## 2020-10-11 LAB — SARS CORONAVIRUS 2 (TAT 6-24 HRS): SARS Coronavirus 2: NEGATIVE

## 2020-10-11 NOTE — H&P (Addendum)
Chief Complaint  Patient presents with  . Knee Pain      Chronic rt knee pain getting worse.         70 year old male with chronic pain in his right knee secondary to osteoarthritis presents for 68-month follow-up, current medications Ultracet and diclofenac also gets injections periodically every 6 months   He says he is finally ready for knee replacement he says he can no longer deal with the pain       Past Medical History:  Diagnosis Date  . Diabetes mellitus without complication (HCC)    . Hypercholesteremia    . Hypertension    . Shortness of breath           Past Surgical History:  Procedure Laterality Date  . BACK SURGERY        ruptured disc  . PROSTATE BIOPSY N/A 01/20/2014    Procedure: PROSTATE BIOPSY;  Surgeon: Ky Barban, MD;  Location: [facility];  Service: Urology;  Laterality: N/A;  . THYROIDECTOMY        Family History  Family history unknown: Yes    Social History         Tobacco Use  . Smoking status: Former Smoker      Packs/day: 1.00      Years: 30.00      Pack years: 30.00      Types: Cigarettes      Quit date: 01/14/2004      Years since quitting: 16.6  . Smokeless tobacco: Never Used  Substance Use Topics  . Alcohol use: No  . Drug use: No    No Known Allergies   Current Outpatient Medications:  .  alfuzosin (UROXATRAL) 10 MG 24 hr tablet, Take 10 mg by mouth every evening. , Disp: , Rfl:  .  amLODipine (NORVASC) 10 MG tablet, Take 10 mg by mouth daily., Disp: , Rfl:  .  aspirin 81 MG tablet, Take 81 mg by mouth daily., Disp: , Rfl:  .  atorvastatin (LIPITOR) 80 MG tablet, , Disp: , Rfl:  .  benzonatate (TESSALON) 100 MG capsule, Take 1-2 capsules (100-200 mg total) by mouth 3 (three) times daily as needed for cough., Disp: 60 capsule, Rfl: 0 .  cetirizine (ZYRTEC ALLERGY) 10 MG tablet, Take 1 tablet (10 mg total) by mouth daily., Disp: 30 tablet, Rfl: 0 .  diclofenac (CATAFLAM) 50 MG tablet, TAKE 1 TABLET (50 MG TOTAL) BY MOUTH 3  (THREE) TIMES DAILY., Disp: 90 tablet, Rfl: 5 .  finasteride (PROSCAR) 5 MG tablet, Take 5 mg by mouth every evening. , Disp: , Rfl:  .  irbesartan (AVAPRO) 150 MG tablet, Take 150 mg by mouth daily., Disp: , Rfl:  .  JARDIANCE 10 MG TABS tablet, Take 10 mg by mouth daily., Disp: , Rfl:  .  LANTUS SOLOSTAR 100 UNIT/ML Solostar Pen, , Disp: , Rfl:  .  lisinopril (ZESTRIL) 40 MG tablet, Take 40 mg by mouth daily., Disp: , Rfl:  .  metFORMIN (GLUCOPHAGE) 1000 MG tablet, Take 1,000 mg by mouth 2 (two) times daily with a meal. , Disp: , Rfl:  .  promethazine-dextromethorphan (PROMETHAZINE-DM) 6.25-15 MG/5ML syrup, Take 5 mLs by mouth at bedtime as needed for cough., Disp: 100 mL, Rfl: 0 .  pseudoephedrine (SUDAFED) 60 MG tablet, Take 1 tablet (60 mg total) by mouth every 8 (eight) hours as needed for congestion., Disp: 30 tablet, Rfl: 0 .  insulin aspart (NOVOLOG) 100 UNIT/ML injection, Inject 8-12 Units into the  skin 3 (three) times daily before meals. SLIDING SCALE (Patient not taking: Reported on 08/18/2020), Disp: , Rfl:  .  traMADol-acetaminophen (ULTRACET) 37.5-325 MG tablet, Take 1 tablet by mouth every 4 (four) hours as needed., Disp: 42 tablet, Rfl: 0 .  VICTOZA 18 MG/3ML SOPN, Inject 1.8 mg into the skin daily.  (Patient not taking: Reported on 08/18/2020), Disp: , Rfl:    BP 133/79   Pulse (!) 104   Ht 5\' 9"  (1.753 m)   Wt 211 lb (95.7 kg)   BMI 31.16 kg/m    He is awake alert and oriented x3 mood and affect are normal he has no gross abnormalities he is walking without a limp or cane   His right knee comes to full extension has a severe bow is flexion is 125 degrees ligaments are stable has tenderness over the medial compartment and patellofemoral joint with some crepitance     X-rays show a mild varus deformity less than 10 degrees with severe cartilage loss medial compartment     Right total knee   The procedure has been fully reviewed with the patient; The risks and benefits  of surgery have been discussed and explained and understood. Alternative treatment has also been reviewed, questions were encouraged and answered. The postoperative plan is also been reviewed.

## 2020-10-12 ENCOUNTER — Observation Stay (HOSPITAL_COMMUNITY)
Admission: RE | Admit: 2020-10-12 | Discharge: 2020-10-12 | Disposition: A | Discharge status: Home / Self Care | Payer: Medicare Other | Attending: Orthopedic Surgery | Admitting: Orthopedic Surgery

## 2020-10-12 ENCOUNTER — Encounter (HOSPITAL_COMMUNITY)
Admission: RE | Disposition: A | Discharge status: Home / Self Care | Payer: Self-pay | Source: Home / Self Care | Attending: Orthopedic Surgery

## 2020-10-12 ENCOUNTER — Ambulatory Visit (HOSPITAL_COMMUNITY): Payer: Medicare Other | Admitting: Anesthesiology

## 2020-10-12 ENCOUNTER — Ambulatory Visit (HOSPITAL_COMMUNITY): Payer: Medicare Other

## 2020-10-12 ENCOUNTER — Encounter (HOSPITAL_COMMUNITY): Payer: Self-pay | Admitting: Orthopedic Surgery

## 2020-10-12 DIAGNOSIS — I10 Essential (primary) hypertension: Secondary | ICD-10-CM | POA: Diagnosis not present

## 2020-10-12 DIAGNOSIS — E119 Type 2 diabetes mellitus without complications: Secondary | ICD-10-CM | POA: Insufficient documentation

## 2020-10-12 DIAGNOSIS — Z87891 Personal history of nicotine dependence: Secondary | ICD-10-CM | POA: Insufficient documentation

## 2020-10-12 DIAGNOSIS — Z7982 Long term (current) use of aspirin: Secondary | ICD-10-CM | POA: Insufficient documentation

## 2020-10-12 DIAGNOSIS — M1711 Unilateral primary osteoarthritis, right knee: Secondary | ICD-10-CM | POA: Diagnosis not present

## 2020-10-12 DIAGNOSIS — Z96651 Presence of right artificial knee joint: Secondary | ICD-10-CM

## 2020-10-12 DIAGNOSIS — Z79899 Other long term (current) drug therapy: Secondary | ICD-10-CM | POA: Insufficient documentation

## 2020-10-12 DIAGNOSIS — Z794 Long term (current) use of insulin: Secondary | ICD-10-CM | POA: Diagnosis not present

## 2020-10-12 DIAGNOSIS — M25561 Pain in right knee: Secondary | ICD-10-CM | POA: Diagnosis present

## 2020-10-12 HISTORY — PX: TOTAL KNEE ARTHROPLASTY: SHX125

## 2020-10-12 LAB — GLUCOSE, CAPILLARY
Glucose-Capillary: 161 mg/dL — ABNORMAL HIGH (ref 70–99)
Glucose-Capillary: 151 mg/dL — ABNORMAL HIGH (ref 70–99)
Glucose-Capillary: 131 mg/dL — ABNORMAL HIGH (ref 70–99)

## 2020-10-12 LAB — ABO/RH: ABO/RH(D): A POS

## 2020-10-12 SURGERY — ARTHROPLASTY, KNEE, TOTAL
Anesthesia: Spinal | Site: Knee | Laterality: Right

## 2020-10-12 MED ORDER — GLYCOPYRROLATE 0.2 MG/ML IJ SOLN
INTRAMUSCULAR | Status: DC | PRN
  Administered 2020-10-12: .2 mg via INTRAVENOUS

## 2020-10-12 MED ORDER — FENTANYL CITRATE (PF) 100 MCG/2ML IJ SOLN
INTRAMUSCULAR | Status: AC
  Filled 2020-10-12: qty 2

## 2020-10-12 MED ORDER — HYDROCODONE-ACETAMINOPHEN 7.5-325 MG PO TABS: 1.0000 | ORAL_TABLET | ORAL | 0 refills | Status: AC | PRN

## 2020-10-12 MED ORDER — PROPOFOL 500 MG/50ML IV EMUL
INTRAVENOUS | Status: DC | PRN
  Administered 2020-10-12: 50 ug/kg/min via INTRAVENOUS

## 2020-10-12 MED ORDER — MENTHOL 3 MG MT LOZG: 1.0000 | LOZENGE | OROMUCOSAL | Status: DC | PRN

## 2020-10-12 MED ORDER — METHOCARBAMOL 500 MG PO TABS: 500.0000 mg | ORAL_TABLET | Freq: Four times a day (QID) | ORAL | Status: DC | PRN

## 2020-10-12 MED ORDER — BUPIVACAINE IN DEXTROSE 0.75-8.25 % IT SOLN
INTRATHECAL | Status: DC | PRN
  Administered 2020-10-12: 2 mL via INTRATHECAL

## 2020-10-12 MED ORDER — LACTATED RINGERS IV SOLN: INTRAVENOUS | Status: DC

## 2020-10-12 MED ORDER — BUPIVACAINE LIPOSOME 1.3 % IJ SUSP
INTRAMUSCULAR | Status: DC | PRN
  Administered 2020-10-12: 20 mL

## 2020-10-12 MED ORDER — SENNOSIDES-DOCUSATE SODIUM 8.6-50 MG PO TABS
1.0000 | ORAL_TABLET | Freq: Every evening | ORAL | Status: DC | PRN
Start: 2020-10-12 — End: 2020-10-12
  Filled 2020-10-12: qty 1

## 2020-10-12 MED ORDER — SODIUM CHLORIDE 0.9 % IV SOLN: INTRAVENOUS | Status: DC

## 2020-10-12 MED ORDER — ASPIRIN 81 MG PO CHEW: 81.0000 mg | CHEWABLE_TABLET | Freq: Every day | ORAL | Status: DC

## 2020-10-12 MED ORDER — MIDAZOLAM HCL 2 MG/2ML IJ SOLN
INTRAMUSCULAR | Status: AC
  Filled 2020-10-12: qty 2

## 2020-10-12 MED ORDER — METHOCARBAMOL 1000 MG/10ML IJ SOLN
500.0000 mg | Freq: Once | INTRAVENOUS | Status: AC
  Administered 2020-10-12: 500 mg via INTRAVENOUS
  Filled 2020-10-12: qty 5

## 2020-10-12 MED ORDER — ONDANSETRON HCL 4 MG/2ML IJ SOLN
4.0000 mg | Freq: Once | INTRAMUSCULAR | Status: AC
  Administered 2020-10-12: 4 mg via INTRAVENOUS
  Filled 2020-10-12: qty 2

## 2020-10-12 MED ORDER — METOCLOPRAMIDE HCL 5 MG PO TABS
5.0000 mg | ORAL_TABLET | Freq: Three times a day (TID) | ORAL | Status: DC | PRN
Start: 2020-10-12 — End: 2020-10-12
  Filled 2020-10-12: qty 2

## 2020-10-12 MED ORDER — FINASTERIDE 5 MG PO TABS
5.0000 mg | ORAL_TABLET | Freq: Every evening | ORAL | Status: DC
  Filled 2020-10-12 (×3): qty 1

## 2020-10-12 MED ORDER — PROPOFOL 10 MG/ML IV BOLUS
INTRAVENOUS | Status: AC
  Filled 2020-10-12: qty 20

## 2020-10-12 MED ORDER — ONDANSETRON HCL 4 MG/2ML IJ SOLN
INTRAMUSCULAR | Status: DC | PRN
  Administered 2020-10-12: 4 mg via INTRAVENOUS

## 2020-10-12 MED ORDER — SENNOSIDES-DOCUSATE SODIUM 8.6-50 MG PO TABS: 1.0000 | ORAL_TABLET | Freq: Every evening | ORAL | 0 refills | Status: DC | PRN

## 2020-10-12 MED ORDER — BUPIVACAINE-EPINEPHRINE (PF) 0.5% -1:200000 IJ SOLN
INTRAMUSCULAR | Status: AC
  Filled 2020-10-12: qty 30

## 2020-10-12 MED ORDER — ONDANSETRON HCL 4 MG PO TABS: 4.0000 mg | ORAL_TABLET | Freq: Four times a day (QID) | ORAL | Status: DC | PRN

## 2020-10-12 MED ORDER — CHLORHEXIDINE GLUCONATE 0.12 % MT SOLN
15.0000 mL | Freq: Once | OROMUCOSAL | Status: AC
  Administered 2020-10-12: 15 mL via OROMUCOSAL

## 2020-10-12 MED ORDER — INSULIN GLARGINE 100 UNIT/ML ~~LOC~~ SOLN
30.0000 [IU] | Freq: Two times a day (BID) | SUBCUTANEOUS | Status: DC
  Administered 2020-10-12: 30 [IU] via SUBCUTANEOUS
  Filled 2020-10-12 (×5): qty 0.3

## 2020-10-12 MED ORDER — CEFAZOLIN SODIUM-DEXTROSE 2-4 GM/100ML-% IV SOLN: 2.0000 g | Freq: Four times a day (QID) | INTRAVENOUS | Status: DC

## 2020-10-12 MED ORDER — 0.9 % SODIUM CHLORIDE (POUR BTL) OPTIME
TOPICAL | Status: DC | PRN
  Administered 2020-10-12: 1000 mL

## 2020-10-12 MED ORDER — FENTANYL CITRATE (PF) 100 MCG/2ML IJ SOLN
INTRAMUSCULAR | Status: DC | PRN
  Administered 2020-10-12 (×2): 50 ug via INTRAVENOUS

## 2020-10-12 MED ORDER — LACTATED RINGERS IV BOLUS: 250.0000 mL | Freq: Once | INTRAVENOUS | Status: DC

## 2020-10-12 MED ORDER — MORPHINE SULFATE (PF) 2 MG/ML IV SOLN: 0.5000 mg | INTRAVENOUS | Status: DC | PRN

## 2020-10-12 MED ORDER — CHLORHEXIDINE GLUCONATE 0.12 % MT SOLN
OROMUCOSAL | Status: AC
  Filled 2020-10-12: qty 15

## 2020-10-12 MED ORDER — SODIUM CHLORIDE 0.9 % IR SOLN
Status: DC | PRN
  Administered 2020-10-12: 3000 mL

## 2020-10-12 MED ORDER — ATORVASTATIN CALCIUM 40 MG PO TABS
40.0000 mg | ORAL_TABLET | Freq: Every day | ORAL | Status: DC
  Administered 2020-10-12: 40 mg via ORAL
  Filled 2020-10-12 (×4): qty 1

## 2020-10-12 MED ORDER — BISACODYL 5 MG PO TBEC
5.0000 mg | DELAYED_RELEASE_TABLET | Freq: Every day | ORAL | Status: DC | PRN
  Filled 2020-10-12: qty 1

## 2020-10-12 MED ORDER — POVIDONE-IODINE 10 % EX SWAB: 2.0000 "application " | Freq: Once | CUTANEOUS | Status: DC

## 2020-10-12 MED ORDER — OXYCODONE HCL 5 MG PO TABS
5.0000 mg | ORAL_TABLET | Freq: Once | ORAL | Status: AC
  Administered 2020-10-12: 5 mg via ORAL
  Filled 2020-10-12: qty 1

## 2020-10-12 MED ORDER — METHOCARBAMOL 500 MG PO TABS: 500.0000 mg | ORAL_TABLET | Freq: Four times a day (QID) | ORAL | 3 refills | Status: DC | PRN

## 2020-10-12 MED ORDER — PREGABALIN 50 MG PO CAPS
50.0000 mg | ORAL_CAPSULE | Freq: Once | ORAL | Status: AC
  Administered 2020-10-12: 50 mg via ORAL
  Filled 2020-10-12: qty 1

## 2020-10-12 MED ORDER — ONDANSETRON HCL 4 MG/2ML IJ SOLN: 4.0000 mg | Freq: Four times a day (QID) | INTRAMUSCULAR | Status: DC | PRN

## 2020-10-12 MED ORDER — ASPIRIN EC 81 MG PO TBEC
81.0000 mg | DELAYED_RELEASE_TABLET | Freq: Every day | ORAL | Status: DC
  Filled 2020-10-12: qty 1

## 2020-10-12 MED ORDER — TRANEXAMIC ACID-NACL 1000-0.7 MG/100ML-% IV SOLN
INTRAVENOUS | Status: AC
  Filled 2020-10-12: qty 100

## 2020-10-12 MED ORDER — GLYCOPYRROLATE PF 0.2 MG/ML IJ SOSY
PREFILLED_SYRINGE | INTRAMUSCULAR | Status: AC
  Filled 2020-10-12: qty 1

## 2020-10-12 MED ORDER — CEFAZOLIN SODIUM-DEXTROSE 2-4 GM/100ML-% IV SOLN
2.0000 g | INTRAVENOUS | Status: AC
  Administered 2020-10-12: 2 g via INTRAVENOUS

## 2020-10-12 MED ORDER — LACTATED RINGERS IV BOLUS
500.0000 mL | Freq: Once | INTRAVENOUS | Status: AC
  Administered 2020-10-12: 500 mL via INTRAVENOUS

## 2020-10-12 MED ORDER — CEFAZOLIN SODIUM-DEXTROSE 2-4 GM/100ML-% IV SOLN
INTRAVENOUS | Status: AC
  Filled 2020-10-12: qty 100

## 2020-10-12 MED ORDER — LISINOPRIL 10 MG PO TABS
40.0000 mg | ORAL_TABLET | Freq: Every day | ORAL | Status: DC
  Filled 2020-10-12 (×2): qty 4

## 2020-10-12 MED ORDER — ALFUZOSIN HCL ER 10 MG PO TB24
10.0000 mg | ORAL_TABLET | Freq: Every evening | ORAL | Status: DC
  Filled 2020-10-12: qty 1

## 2020-10-12 MED ORDER — ACETAMINOPHEN 500 MG PO TABS
500.0000 mg | ORAL_TABLET | Freq: Four times a day (QID) | ORAL | Status: DC
  Administered 2020-10-12: 500 mg via ORAL
  Filled 2020-10-12: qty 1

## 2020-10-12 MED ORDER — METHOCARBAMOL 1000 MG/10ML IJ SOLN: 500.0000 mg | Freq: Four times a day (QID) | INTRAVENOUS | Status: DC | PRN

## 2020-10-12 MED ORDER — CELECOXIB 400 MG PO CAPS
400.0000 mg | ORAL_CAPSULE | Freq: Once | ORAL | Status: AC
  Administered 2020-10-12: 400 mg via ORAL
  Filled 2020-10-12: qty 1

## 2020-10-12 MED ORDER — HYDROCODONE-ACETAMINOPHEN 7.5-325 MG PO TABS
1.0000 | ORAL_TABLET | ORAL | Status: DC | PRN
  Administered 2020-10-12: 1 via ORAL
  Filled 2020-10-12: qty 1

## 2020-10-12 MED ORDER — EMPAGLIFLOZIN 10 MG PO TABS
10.0000 mg | ORAL_TABLET | Freq: Every day | ORAL | Status: DC
  Filled 2020-10-12 (×4): qty 1

## 2020-10-12 MED ORDER — GABAPENTIN 300 MG PO CAPS
300.0000 mg | ORAL_CAPSULE | Freq: Three times a day (TID) | ORAL | Status: DC
  Administered 2020-10-12: 300 mg via ORAL
  Filled 2020-10-12: qty 1

## 2020-10-12 MED ORDER — BUPIVACAINE LIPOSOME 1.3 % IJ SUSP
INTRAMUSCULAR | Status: AC
  Filled 2020-10-12: qty 20

## 2020-10-12 MED ORDER — AMLODIPINE BESYLATE 5 MG PO TABS
10.0000 mg | ORAL_TABLET | Freq: Every day | ORAL | Status: DC
  Filled 2020-10-12 (×4): qty 1

## 2020-10-12 MED ORDER — PHENOL 1.4 % MT LIQD: 1.0000 | OROMUCOSAL | Status: DC | PRN

## 2020-10-12 MED ORDER — MIDAZOLAM HCL 5 MG/5ML IJ SOLN
INTRAMUSCULAR | Status: DC | PRN
  Administered 2020-10-12: 2 mg via INTRAVENOUS

## 2020-10-12 MED ORDER — DIPHENHYDRAMINE HCL 12.5 MG/5ML PO ELIX
12.5000 mg | ORAL_SOLUTION | ORAL | Status: DC | PRN
  Filled 2020-10-12: qty 10

## 2020-10-12 MED ORDER — PANTOPRAZOLE SODIUM 40 MG PO TBEC
40.0000 mg | DELAYED_RELEASE_TABLET | Freq: Every day | ORAL | Status: DC
  Administered 2020-10-12: 40 mg via ORAL
  Filled 2020-10-12 (×4): qty 1

## 2020-10-12 MED ORDER — METOCLOPRAMIDE HCL 5 MG/ML IJ SOLN: 5.0000 mg | Freq: Three times a day (TID) | INTRAMUSCULAR | Status: DC | PRN

## 2020-10-12 MED ORDER — TRANEXAMIC ACID-NACL 1000-0.7 MG/100ML-% IV SOLN
1000.0000 mg | INTRAVENOUS | Status: AC
  Administered 2020-10-12: 1000 mg via INTRAVENOUS

## 2020-10-12 MED ORDER — ALUM & MAG HYDROXIDE-SIMETH 200-200-20 MG/5ML PO SUSP: 30.0000 mL | ORAL | Status: DC | PRN

## 2020-10-12 MED ORDER — HYDROCODONE-ACETAMINOPHEN 5-325 MG PO TABS
1.0000 | ORAL_TABLET | ORAL | Status: DC | PRN
Start: 2020-10-12 — End: 2020-10-12

## 2020-10-12 MED ORDER — TRAMADOL HCL 50 MG PO TABS: 50.0000 mg | ORAL_TABLET | Freq: Four times a day (QID) | ORAL | Status: DC

## 2020-10-12 MED ORDER — CELECOXIB 100 MG PO CAPS: 200.0000 mg | ORAL_CAPSULE | Freq: Two times a day (BID) | ORAL | Status: DC

## 2020-10-12 MED ORDER — CELECOXIB 200 MG PO CAPS: 200.0000 mg | ORAL_CAPSULE | Freq: Two times a day (BID) | ORAL | 0 refills | Status: DC

## 2020-10-12 MED ORDER — DOCUSATE SODIUM 100 MG PO CAPS: 100.0000 mg | ORAL_CAPSULE | Freq: Two times a day (BID) | ORAL | Status: DC

## 2020-10-12 MED ORDER — MAGNESIUM CITRATE PO SOLN
1.0000 | Freq: Once | ORAL | Status: DC | PRN
  Filled 2020-10-12: qty 296

## 2020-10-12 MED ORDER — ORAL CARE MOUTH RINSE: 15.0000 mL | Freq: Once | OROMUCOSAL | Status: AC

## 2020-10-12 MED ORDER — TRANEXAMIC ACID-NACL 1000-0.7 MG/100ML-% IV SOLN: 1000.0000 mg | Freq: Once | INTRAVENOUS | Status: DC

## 2020-10-12 MED ORDER — METFORMIN HCL 500 MG PO TABS
1000.0000 mg | ORAL_TABLET | Freq: Two times a day (BID) | ORAL | Status: DC
  Filled 2020-10-12 (×2): qty 2

## 2020-10-12 MED ORDER — ACETAMINOPHEN 325 MG PO TABS: 325.0000 mg | ORAL_TABLET | Freq: Four times a day (QID) | ORAL | Status: DC | PRN

## 2020-10-12 MED ORDER — GABAPENTIN 100 MG PO CAPS: 100.0000 mg | ORAL_CAPSULE | Freq: Three times a day (TID) | ORAL | 0 refills | Status: DC

## 2020-10-12 MED ORDER — ROPIVACAINE HCL 5 MG/ML IJ SOLN
INTRAMUSCULAR | Status: AC
  Filled 2020-10-12: qty 30

## 2020-10-12 SURGICAL SUPPLY — 74 items
ATTUNE MED DOME PAT 38 KNEE (Knees) ×1 IMPLANT
ATTUNE PS FEM RT SZ 7 CEM KNEE (Femur) ×1 IMPLANT
BANDAGE ELASTIC 4 VELCRO NS (GAUZE/BANDAGES/DRESSINGS) ×2 IMPLANT
BANDAGE ELASTIC 6 VELCRO NS (GAUZE/BANDAGES/DRESSINGS) ×1 IMPLANT
BANDAGE ESMARK 6X9 LF (GAUZE/BANDAGES/DRESSINGS) ×1 IMPLANT
BASEPLATE TIB CMT FB PCKT SZ 8 (Knees) ×1 IMPLANT
BIT DRILL 3.2X128 (BIT) IMPLANT
BLADE HEX COATED 2.75 (ELECTRODE) ×2 IMPLANT
BLADE SAGITTAL 25.0X1.27X90 (BLADE) ×2 IMPLANT
BLADE SAW SGTL 11.0X1.19X90.0M (BLADE) ×2 IMPLANT
BNDG CMPR 9X6 STRL LF SNTH (GAUZE/BANDAGES/DRESSINGS) ×1
BNDG CMPR STD VLCR NS LF 5.8X4 (GAUZE/BANDAGES/DRESSINGS) ×2
BNDG CMPR STD VLCR NS LF 5.8X6 (GAUZE/BANDAGES/DRESSINGS) ×1
BNDG ELASTIC 4X5.8 VLCR NS LF (GAUZE/BANDAGES/DRESSINGS) ×4 IMPLANT
BNDG ELASTIC 6X5.8 VLCR NS LF (GAUZE/BANDAGES/DRESSINGS) ×2 IMPLANT
BNDG ESMARK 6X9 LF (GAUZE/BANDAGES/DRESSINGS) ×2
BOWL SMART MIX CTS (DISPOSABLE) IMPLANT
BSPLAT TIB 8 CMNT FXBRNG STRL (Knees) ×1 IMPLANT
CATH FOLEY LATEX FREE 14FR (CATHETERS) ×2
CATH FOLEY LF 14FR (CATHETERS) IMPLANT
CEMENT HV SMART SET (Cement) ×4 IMPLANT
CLOTH BEACON ORANGE TIMEOUT ST (SAFETY) ×2 IMPLANT
COOLER ICEMAN CLASSIC (MISCELLANEOUS) ×2 IMPLANT
COVER LIGHT HANDLE STERIS (MISCELLANEOUS) ×4 IMPLANT
COVER WAND RF STERILE (DRAPES) ×2 IMPLANT
CUFF TOURN SGL QUICK 34 (TOURNIQUET CUFF) ×2
CUFF TRNQT CYL 34X4.125X (TOURNIQUET CUFF) ×1 IMPLANT
DRAPE BACK TABLE (DRAPES) ×2 IMPLANT
DRAPE EXTREMITY T 121X128X90 (DISPOSABLE) ×2 IMPLANT
DRSG MEPILEX BORDER 4X12 (GAUZE/BANDAGES/DRESSINGS) ×2 IMPLANT
DURAPREP 26ML APPLICATOR (WOUND CARE) ×4 IMPLANT
ELECT REM PT RETURN 9FT ADLT (ELECTROSURGICAL) ×2
ELECTRODE REM PT RTRN 9FT ADLT (ELECTROSURGICAL) ×1 IMPLANT
GLOVE SKINSENSE NS SZ8.0 LF (GLOVE) ×2
GLOVE SKINSENSE STRL SZ8.0 LF (GLOVE) ×2 IMPLANT
GLOVE SS N UNI LF 8.5 STRL (GLOVE) ×2 IMPLANT
GLOVE SURG UNDER POLY LF SZ7 (GLOVE) ×4 IMPLANT
GOWN STRL REUS W/TWL LRG LVL3 (GOWN DISPOSABLE) ×6 IMPLANT
GOWN STRL REUS W/TWL XL LVL3 (GOWN DISPOSABLE) ×2 IMPLANT
HANDPIECE INTERPULSE COAX TIP (DISPOSABLE) ×2
HOOD W/PEELAWAY (MISCELLANEOUS) ×8 IMPLANT
INSERT TIB ATTUNE FB SZ7X5 (Insert) ×1 IMPLANT
INST SET MAJOR BONE (KITS) ×2 IMPLANT
IV NS IRRIG 3000ML ARTHROMATIC (IV SOLUTION) ×2 IMPLANT
KIT BLADEGUARD II DBL (SET/KITS/TRAYS/PACK) ×2 IMPLANT
KIT TURNOVER KIT A (KITS) ×2 IMPLANT
MANIFOLD NEPTUNE II (INSTRUMENTS) ×2 IMPLANT
MARKER SKIN DUAL TIP RULER LAB (MISCELLANEOUS) ×2 IMPLANT
NDL HYPO 18GX1.5 BLUNT FILL (NEEDLE) IMPLANT
NDL HYPO 21X1.5 SAFETY (NEEDLE) IMPLANT
NEEDLE HYPO 18GX1.5 BLUNT FILL (NEEDLE) IMPLANT
NEEDLE HYPO 21X1.5 SAFETY (NEEDLE) IMPLANT
NS IRRIG 1000ML POUR BTL (IV SOLUTION) ×2 IMPLANT
PACK TOTAL JOINT (CUSTOM PROCEDURE TRAY) ×2 IMPLANT
PAD ARMBOARD 7.5X6 YLW CONV (MISCELLANEOUS) ×2 IMPLANT
PAD COLD SHLDR WRAP-ON (PAD) ×2 IMPLANT
PENCIL SMOKE EVACUATOR (MISCELLANEOUS) ×2 IMPLANT
PILLOW KNEE EXTENSION 0 DEG (MISCELLANEOUS) ×2 IMPLANT
PIN/DRILL PACK ORTHO 1/8X3.0 (PIN) ×2 IMPLANT
SAW OSC TIP CART 19.5X105X1.3 (SAW) ×2 IMPLANT
SET BASIN LINEN APH (SET/KITS/TRAYS/PACK) ×2 IMPLANT
SET HNDPC FAN SPRY TIP SCT (DISPOSABLE) ×1 IMPLANT
STAPLER VISISTAT 35W (STAPLE) ×2 IMPLANT
SUT BRALON NAB BRD #1 30IN (SUTURE) ×2 IMPLANT
SUT MNCRL 0 VIOLET CTX 36 (SUTURE) ×1 IMPLANT
SUT MON AB 0 CT1 (SUTURE) ×2 IMPLANT
SUT MONOCRYL 0 CTX 36 (SUTURE) ×2
SYR 20ML LL LF (SYRINGE) ×1 IMPLANT
SYR BULB IRRIG 60ML STRL (SYRINGE) ×2 IMPLANT
TOWEL OR 17X26 4PK STRL BLUE (TOWEL DISPOSABLE) ×2 IMPLANT
TOWER CARTRIDGE SMART MIX (DISPOSABLE) ×2 IMPLANT
TRAY FOLEY MTR SLVR 16FR STAT (SET/KITS/TRAYS/PACK) ×2 IMPLANT
WATER STERILE IRR 1000ML POUR (IV SOLUTION) ×4 IMPLANT
YANKAUER SUCT 12FT TUBE ARGYLE (SUCTIONS) ×2 IMPLANT

## 2020-10-12 NOTE — Op Note (Signed)
10/12/2020  9:45 AM  PATIENT:  Chris Wade  70 y.o. male  PRE-OPERATIVE DIAGNOSIS:  right knee osteoarthritis  POST-OPERATIVE DIAGNOSIS:  right knee osteoarthritis  PROCEDURE:  Procedure(s): TOTAL KNEE ARTHROPLASTY (Right)   IMPLANTS: DEPUY ATTUNE FB PS  29F 8T 38P 5 POLY   SURGEON:  Surgeon(s) and Role:    * Vickki Hearing, MD - Primary  PHYSICIAN ASSISTANT:   ASSISTANTS: CYNTHIA WRENN   ANESTHESIA:   spinal and Postop saphenous nerve block  EBL:  50 mL   BLOOD ADMINISTERED:none  DRAINS: none   LOCAL MEDICATIONS USED:  OTHER 20 cc full-strength exparel  SPECIMEN:  No Specimen  DISPOSITION OF SPECIMEN:  N/A  COUNTS:  YES  TOURNIQUET:   Total Tourniquet Time Documented: Thigh (Right) - 89 minutes Total: Thigh (Right) - 89 minutes   DICTATION: .[name] Dictation  Surgery was done as follows.  Patient was seen in preop.  Right knee was confirmed as the surgical site and it was marked.  Chart review and x-ray review was completed  The patient was taken to the operating for spinal anesthesia.  He was then placed supine and a Foley catheter was inserted.  We placed a tourniquet on his thigh and then his right leg was prepped and draped sterilely.  After the timeout was completed the limb was exsanguinated with a 6 inch Esmarch up to a tourniquet pressure of 275 mmHg where it stayed for 88 minutes  A midline incision was made over the patella extended proximally and distally down to the extensor mechanism.  A medial arthrotomy was performed the patella was everted.  Fat pad was excised  The medial soft tissue sleeve was elevated to the mid coronal plane excising the medial meniscus.  The ACL and PCL were resected as was the lateral meniscus  A 3/8 inch drill bit was used to open the femoral canal and then a intramedullary rod was placed with the distal femoral setting FOR A 9 mm resection 5 degree right The resection seem to be inadequate so an additional 2 mm  of bone was resected after the osteotomy fragments were measured to be only 7 mm.  We then sized the femur to a size 7 and perform the 4 distal cuts  The tibia was then subluxated forward and any meniscal remnants were removed and any PCL remnants were removed.  The tibial guide was set for a neutral cut referencing 9 mm from the higher lateral side aiming for mid third tibial tubercle and 3 degree slope.  Once this resection was made a 5 mm spacer block was placed there was tight flexion and extension therefore an additional 2 mm were resected from the proximal tibia  Once this was accomplished the 5 mm spacer block balance the knee in extension and was stable in flexion with the rotation test and the shuck test  The 7 mm notch guide was placed on the femur and the notch cut was made followed by trial reduction.  We had full extension and passive flexion of 115 degrees  Note preop range of motion was 5-1 05  We then prepared the proximal tibia.  We then cut the patella from a 25 down to a 13 and placed a 38 x 10 mm button  Trial range of motion showed no patellar subluxation  The wound was then irrigated and 20 cc of exparel was placed in the quadriceps tendon and medial and lateral collateral ligaments and posterior capsule;  the implants  were cemented in place excess cement was removed.  The wound was irrigated again and then the wound was closed with #1 Braylon in interrupted fashion for the extensor mechanism and 2-0 Monocryl for the skin followed by staples and sterile dressing  The postop block will be placed in PACU  Patient scheduled for same day surgery protocol    PLAN OF CARE: Discharge to home after PACU  PATIENT DISPOSITION:  PACU - hemodynamically stable.   Delay start of Pharmacological VTE agent (>24hrs) due to surgical blood loss or risk of bleeding: yes

## 2020-10-12 NOTE — Brief Op Note (Signed)
10/12/2020  9:45 AM  PATIENT:  Chris Wade  70 y.o. male  PRE-OPERATIVE DIAGNOSIS:  right knee osteoarthritis  POST-OPERATIVE DIAGNOSIS:  right knee osteoarthritis  PROCEDURE:  Procedure(s): TOTAL KNEE ARTHROPLASTY (Right)   IMPLANTS: DEPUY ATTUNE FB PS  47F 8T 38P 5 POLY   SURGEON:  Surgeon(s) and Role:    * Vickki Hearing, MD - Primary  PHYSICIAN ASSISTANT:   ASSISTANTS: CYNTHIA WRENN   ANESTHESIA:   spinal and Postop saphenous nerve block  EBL:  50 mL   BLOOD ADMINISTERED:none  DRAINS: none   LOCAL MEDICATIONS USED:  OTHER 20 cc full-strength exparel  SPECIMEN:  No Specimen  DISPOSITION OF SPECIMEN:  N/A  COUNTS:  YES  TOURNIQUET:   Total Tourniquet Time Documented: Thigh (Right) - 89 minutes Total: Thigh (Right) - 89 minutes   DICTATION: .[name] Dictation  PLAN OF CARE: Discharge to home after PACU  PATIENT DISPOSITION:  PACU - hemodynamically stable.   Delay start of Pharmacological VTE agent (>24hrs) due to surgical blood loss or risk of bleeding: yes

## 2020-10-12 NOTE — Progress Notes (Signed)
Instructed on incentive spirometer. 2000 ml obtained. Tolerated well. 

## 2020-10-12 NOTE — Anesthesia Procedure Notes (Signed)
Spinal  Patient location during procedure: OR Start time: 10/12/2020 7:35 AM End time: 10/12/2020 7:39 AM Staffing Performed: resident/CRNA  Resident/CRNA: Shanon Payor, CRNA Preanesthetic Checklist Completed: patient identified, IV checked, site marked, risks and benefits discussed, surgical consent, monitors and equipment checked, pre-op evaluation and timeout performed Spinal Block Patient position: sitting Prep: ChloraPrep Patient monitoring: heart rate, cardiac monitor, continuous pulse ox and blood pressure Approach: midline Location: L4-5 Injection technique: single-shot Needle Needle type: Pencan  Needle gauge: 22 G Needle length: 5 cm Assessment Sensory level: T6

## 2020-10-12 NOTE — Anesthesia Preprocedure Evaluation (Signed)
Anesthesia Evaluation  Patient identified by MRN, date of birth, ID band Patient awake    Reviewed: Allergy & Precautions, H&P , NPO status , Patient's Chart, lab work & pertinent test results, reviewed documented beta blocker date and time   Airway Mallampati: II  TM Distance: >3 FB Neck ROM: full    Dental no notable dental hx.    Pulmonary shortness of breath, former smoker,    Pulmonary exam normal breath sounds clear to auscultation       Cardiovascular Exercise Tolerance: Good hypertension, negative cardio ROS   Rhythm:regular Rate:Normal     Neuro/Psych negative neurological ROS  negative psych ROS   GI/Hepatic negative GI ROS, Neg liver ROS,   Endo/Other  negative endocrine ROSdiabetes, Type 2  Renal/GU negative Renal ROS  negative genitourinary   Musculoskeletal   Abdominal   Peds  Hematology negative hematology ROS (+)   Anesthesia Other Findings   Reproductive/Obstetrics negative OB ROS                             Anesthesia Physical Anesthesia Plan  ASA: II  Anesthesia Plan: Spinal   Post-op Pain Management:  Regional for Post-op pain   Induction:   PONV Risk Score and Plan:   Airway Management Planned:   Additional Equipment:   Intra-op Plan:   Post-operative Plan:   Informed Consent: I have reviewed the patients History and Physical, chart, labs and discussed the procedure including the risks, benefits and alternatives for the proposed anesthesia with the patient or authorized representative who has indicated his/her understanding and acceptance.     Dental Advisory Given  Plan Discussed with: CRNA  Anesthesia Plan Comments:         Anesthesia Quick Evaluation

## 2020-10-12 NOTE — Anesthesia Procedure Notes (Signed)
Anesthesia Regional Block: Adductor canal block   Pre-Anesthetic Checklist: ,, timeout performed, Correct Patient, Correct Site, Correct Laterality, Correct Procedure, Correct Position, site marked, Risks and benefits discussed,  Surgical consent,  Pre-op evaluation,  At surgeon's request and post-op pain management  Laterality: Right  Prep: chloraprep       Needles:   Needle Type: Stimulator Needle - 80     Needle Length: 10cm  Needle Gauge: 22     Additional Needles:   Procedures:,,,, ultrasound used (permanent image in chart),,,,  Narrative:  Start time: 10/12/2020 10:11 AM End time: 10/12/2020 10:13 AM Injection made incrementally with aspirations every 5 mL.

## 2020-10-12 NOTE — Plan of Care (Signed)
  Problem: Acute Rehab PT Goals(only PT should resolve) Goal: Pt Will Go Supine/Side To Sit Outcome: Progressing Flowsheets (Taken 10/12/2020 1511) Pt will go Supine/Side to Sit:  Independently  with modified independence Goal: Patient Will Transfer Sit To/From Stand Outcome: Progressing Flowsheets (Taken 10/12/2020 1511) Patient will transfer sit to/from stand: with modified independence Goal: Pt Will Transfer Bed To Chair/Chair To Bed Outcome: Progressing Flowsheets (Taken 10/12/2020 1511) Pt will Transfer Bed to Chair/Chair to Bed: with modified independence Goal: Pt Will Ambulate Outcome: Progressing Flowsheets (Taken 10/12/2020 1511) Pt will Ambulate:  > 125 feet  with modified independence  with rolling walker   3:11 PM, 10/12/20 Ocie Bob, MPT Physical Therapist with [facility] [phone] office [phone] mobile phone

## 2020-10-12 NOTE — Interval H&P Note (Signed)
History and Physical Interval Note:  10/12/2020 7:18 AM  Chris Wade  has presented today for surgery, with the diagnosis of right knee osteoarthritis.  The various methods of treatment have been discussed with the patient and family. After consideration of risks, benefits and other options for treatment, the patient has consented to  Procedure(s): TOTAL KNEE ARTHROPLASTY (Right) as a surgical intervention.  The patient's history has been reviewed, patient examined, no change in status, stable for surgery.  I have reviewed the patient's chart and labs.  Questions were answered to the patient's satisfaction.     Fuller Canada

## 2020-10-12 NOTE — Anesthesia Postprocedure Evaluation (Signed)
Anesthesia Post Note  Patient: Chris Wade  Procedure(s) Performed: TOTAL KNEE ARTHROPLASTY (Right Knee)  Patient location during evaluation: PACU Anesthesia Type: Spinal Level of consciousness: awake, oriented, awake and alert and patient cooperative Pain management: satisfactory to patient Vital Signs Assessment: post-procedure vital signs reviewed and stable Respiratory status: spontaneous breathing, respiratory function stable, nonlabored ventilation and patient connected to nasal cannula oxygen Cardiovascular status: stable Postop Assessment: no apparent nausea or vomiting Anesthetic complications: no   No complications documented.   Last Vitals:  Vitals:   10/12/20 0700 10/12/20 0715  BP: 129/69   Pulse: 80 81  Resp: 11 20  Temp:    SpO2: 95% 94%    Last Pain:  Vitals:   10/12/20 0643  PainSc: 0-No pain                 GREGORY,SUZANNE

## 2020-10-12 NOTE — TOC Transition Note (Signed)
Transition of Care [facility]) - CM/SW Discharge Note  Patient Details  Name: Chris Wade MRN: [id number] Date of Birth: 07-Jan-1951  [facility]) CM/SW Contact:  Ewing Schlein, LCSW Phone Number: 10/12/2020, 2:46 PM  Clinical Narrative: PT evaluation recommended HHPT services. CSW confirmed with Clifton Custard with [facility] that HHPT orders have been received. Patient already has a walker at home. TOC signing off.  Final next level of care: Home w Home Health Services Barriers to Discharge: Barriers Resolved  Patient Goals and CMS Choice Patient states their goals for this hospitalization and ongoing recovery are:: Discharge home with [facility] services CMS Medicare.gov Compare Post Acute Care list provided to:: Patient Choice offered to / list presented to : Patient  Discharge Plan and Services HH Arranged: PT HH Agency: [facility] (formerly [facility]) Date HH Agency Contacted: 10/12/20 Time HH Agency Contacted: [phone] Representative spoke with at [facility] Agency: Clifton Custard  Readmission Risk Interventions No flowsheet data found.

## 2020-10-12 NOTE — Transfer of Care (Signed)
Immediate Anesthesia Transfer of Care Note  Patient: Chris Wade  Procedure(s) Performed: TOTAL KNEE ARTHROPLASTY (Right Knee)  Patient Location: PACU  Anesthesia Type:Spinal  Level of Consciousness: awake, alert , oriented and patient cooperative  Airway & Oxygen Therapy: Patient Spontanous Breathing and Patient connected to nasal cannula oxygen  Post-op Assessment: Report given to RN and Post -op Vital signs reviewed and stable  Post vital signs: Reviewed and stable  Last Vitals:  Vitals Value Taken Time  BP    Temp    Pulse    Resp    SpO2      Last Pain:  Vitals:   10/12/20 0643  PainSc: 0-No pain         Complications: No complications documented.

## 2020-10-12 NOTE — Discharge Instructions (Signed)
Spinal Anesthesia and Epidural Anesthesia, Care After This sheet gives you information about how to care for yourself after your procedure. Your doctor may also give you more specific instructions. If you have problems or questions, contact your doctor. What can I expect after the procedure? While the medicines you were given are still having effects, it is common to:  Feel like you may vomit (nauseous).  Vomit.  Have a numb feeling or tingling in your legs.  Have problems when you pee (urinate).  Feel itchy. Follow these instructions at home: For the time period you were told by your doctor:  Rest.  Do not do activities where you could fall or get hurt.  Do not drive or use machines.  Do not drink alcohol.  Do not take sleeping pills or medicines that make you drowsy.  Do not make big decisions or sign legal documents.  Do not take care of children on your own.   Eating and drinking  If you vomit, wait a short time. When you can drink without vomiting, try to drink some water, juice, or clear soup.  Drink enough fluid to keep your pee (urine) pale yellow.  Make sure you do not feel like vomiting before you eat solid foods.  Follow the diet that your doctor recommends. General instructions  If you have sleep apnea, surgery and certain medicines can raise your risk for breathing problems. Follow instructions from your doctor about when to wear your sleep device.  Have a responsible adult stay with you for the time you are told. It is important to have someone help care for you until you are awake and alert.  Return to your normal activities as told by your doctor. Ask your doctor what activities are safe for you.  Take over-the-counter and prescription medicines only as told by your doctor.  Do not use any products that contain nicotine or tobacco, such as cigarettes, e-cigarettes, and chewing tobacco. If you need help quitting, ask your doctor.  Keep all follow-up  visits as told by your doctor. This is important. Contact a doctor if:  It has been more than one day since your procedure and you feel like you may vomit or you are vomiting.  You have a rash. Get help right away if:  You have a fever.  You have a headache that lasts a long time.  You have a very bad headache.  Your vision is blurry or you see two of a single object (double vision).  You are dizzy or light-headed.  You faint.  Your arms or legs tingle, feel weak, or get numb.  You have trouble breathing.  You cannot pee. These symptoms may be an emergency. Get help right away. Call your local emergency services (911 in the [place]  Do not wait to see if the symptoms will go away.  Do not drive yourself to the hospital. Summary  After the procedure, have a responsible adult stay with you at home.  Do not do activities that might cause you to get hurt.  Do not drive, use machinery, drink alcohol, or make important decisions as told by your doctor.  Do not use products that contain nicotine or tobacco.  Get help right away if you have a very bad headache, trouble breathing, or you cannot pee. This information is not intended to replace advice given to you by your health care provider. Make sure you discuss any questions you have with your health care provider. Document Revised: 04/22/2020   Document Reviewed: 09/25/2019 Elsevier Patient Education  2021 [employer]  

## 2020-10-12 NOTE — Evaluation (Signed)
Physical Therapy Evaluation Patient Details Name: Chris Wade MRN: [id number] DOB: March 28, 1951 Today's Date: 10/12/2020  RIGHT KNEE ROM:  0 - 115 degrees AMBULATION DISTANCE: 100 feet using RW with Supervison    History of Present Illness  Chris Wade is a 70 y/o male, s/p Right TKA on 10/12/20  with the diagnosis of right knee osteoarthritis.  Clinical Impression  Patient instructed in and given written instructions for HEP with good return demonstrated.  Patient demonstrates good return for moving RLE during bed mobility, labored cadence requiring increased time with fair/good return for right heel to toe stepping without loss of balance, and able to go up/down stairs with Min guard assistance/supervision using side rail without loss of balance.  Patient will benefit from continued physical therapy in hospital and recommended venue below to increase strength, balance, endurance for safe ADLs and gait.    Follow Up Recommendations Home health PT;Supervision for mobility/OOB;Supervision - Intermittent    Equipment Recommendations  None recommended by PT    Recommendations for Other Services       Precautions / Restrictions Precautions Precautions: Fall Restrictions Weight Bearing Restrictions: Yes RLE Weight Bearing: Weight bearing as tolerated      Mobility  Bed Mobility Overal bed mobility: Needs Assistance Bed Mobility: Supine to Sit;Sit to Supine     Supine to sit: Supervision;Modified independent (Device/Increase time) Sit to supine: Supervision   General bed mobility comments: demonstrates good return for moving RLE during bed mobility    Transfers Overall transfer level: Needs assistance Equipment used: Rolling walker (2 wheeled) Transfers: Sit to/from [employer] Sit to Stand: Supervision Stand pivot transfers: Supervision       General transfer comment: slightly labored, increased time  Ambulation/Gait Ambulation/Gait assistance:  Supervision Gait Distance (Feet): 100 Feet Assistive device: Rolling walker (2 wheeled) Gait Pattern/deviations: Decreased step length - right;Decreased stance time - right;Decreased stride length;Antalgic Gait velocity: decreased   General Gait Details: slow slightly labored cadence with fair/good return for right heel to toe stepping without loss of balance, limited secondary to c/o fatigue/increasing right knee pain  Stairs Stairs: Yes Stairs assistance: Supervision;Min guard Stair Management: One rail Left;Step to pattern Number of Stairs: 3 General stair comments: demonstrates good return for going up/down 3 steps using 1 siderail without loss of balance  Wheelchair Mobility    Modified Rankin (Stroke Patients Only)       Balance Overall balance assessment: Needs assistance Sitting-balance support: Feet unsupported;No upper extremity supported Sitting balance-Leahy Scale: Good Sitting balance - Comments: seated at EOB   Standing balance support: During functional activity;Bilateral upper extremity supported Standing balance-Leahy Scale: Fair Standing balance comment: fair/good using RW                             Pertinent Vitals/Pain Pain Assessment: 0-10 Pain Score: 4  Pain Location: right knee Pain Descriptors / Indicators: Sore;Discomfort Pain Intervention(s): Limited activity within patient's tolerance;Monitored during session;Repositioned;Premedicated before session    Home Living Family/patient expects to be discharged to:: Private residence Living Arrangements: Spouse/significant other Available Help at Discharge: Family;Available 24 hours/day Type of Home: House Home Access: Stairs to enter Entrance Stairs-Rails: None Entrance Stairs-Number of Steps: 1 Home Layout: One level;Laundry or work area in basement;Able to live on main level with bedroom/bathroom;Full bath on main level;Two level Home Equipment: Walker - 2 wheels;Bedside  commode;Shower seat;Cane - single point      Prior Function Level of Independence: Independent  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, drives     Hand Dominance   Dominant Hand: Right    Extremity/Trunk Assessment   Upper Extremity Assessment Upper Extremity Assessment: Overall WFL for tasks assessed    Lower Extremity Assessment Lower Extremity Assessment: Generalized weakness;RLE deficits/detail RLE Deficits / Details: grossly 4/5 RLE: Unable to fully assess due to pain RLE Sensation: WNL RLE Coordination: WNL    Cervical / Trunk Assessment Cervical / Trunk Assessment: Normal  Communication   Communication: No difficulties  Cognition Arousal/Alertness: Awake/alert Behavior During Therapy: WFL for tasks assessed/performed Overall Cognitive Status: Within Functional Limits for tasks assessed                                        General Comments      Exercises Total Joint Exercises Ankle Circles/Pumps: Supine;5 reps;Right;Strengthening;AROM Quad Sets: Supine;5 reps;Right;Strengthening;AROM Short Arc Quad: Supine;Right;Strengthening;AROM;10 reps Heel Slides: AROM;Strengthening;Right;Supine;10 reps Goniometric ROM: right knee: 0 - 115 degrees   Assessment/Plan    PT Assessment Patient needs continued PT services  PT Problem List Decreased strength;Decreased range of motion;Decreased activity tolerance;Decreased balance;Decreased mobility       PT Treatment Interventions DME instruction;Gait training;Stair training;Functional mobility training;Therapeutic activities;Therapeutic exercise;Balance training;Patient/family education    PT Goals (Current goals can be found in the Care Plan section)  Acute Rehab PT Goals Patient Stated Goal: return home with family to assist PT Goal Formulation: With patient/family Time For Goal Achievement: 10/14/20 Potential to Achieve Goals: Good    Frequency BID   Barriers to discharge         Co-evaluation               AM-PAC PT "6 Clicks" Mobility  Outcome Measure Help needed turning from your back to your side while in a flat bed without using bedrails?: None Help needed moving from lying on your back to sitting on the side of a flat bed without using bedrails?: A Little Help needed moving to and from a bed to a chair (including a wheelchair)?: A Little Help needed standing up from a chair using your arms (e.g., wheelchair or bedside chair)?: A Little Help needed to walk in hospital room?: A Little Help needed climbing 3-5 steps with a railing? : A Little 6 Click Score: 19    End of Session   Activity Tolerance: Patient tolerated treatment well;Patient limited by fatigue Patient left: in bed;with call bell/phone within reach Nurse Communication: Mobility status PT Visit Diagnosis: Unsteadiness on feet (R26.81);Other abnormalities of gait and mobility (R26.89);Muscle weakness (generalized) (M62.81)    Time: 4098-1191 PT Time Calculation (min) (ACUTE ONLY): 33 min   Charges:   PT Evaluation $PT Eval Moderate Complexity: 1 Mod PT Treatments $Therapeutic Activity: 23-37 mins        3:10 PM, 10/12/20 Ocie Bob, MPT Physical Therapist with [facility] [phone] office [phone] mobile phone

## 2020-10-13 ENCOUNTER — Encounter (HOSPITAL_COMMUNITY): Payer: Self-pay | Admitting: Orthopedic Surgery

## 2020-10-14 ENCOUNTER — Telehealth: Payer: Self-pay | Admitting: Orthopedic Surgery

## 2020-10-14 NOTE — Telephone Encounter (Signed)
Joey from [facility] asked that you call him regarding this patient's plan of care.  Please call Joey at [phone]

## 2020-10-14 NOTE — Telephone Encounter (Signed)
Left message to give verbal orders, and asked him to call back to discuss if he has specific questions

## 2020-10-19 NOTE — Discharge Summary (Signed)
Physician Discharge Summary  Patient ID: Chris Wade MRN: [id number] DOB/AGE: [birth year] 68 y.o.  Admit date: 10/12/2020 Discharge date: October 12, 2020  Admission Diagnoses: Osteoarthritis right knee  Discharge Diagnoses: Same  Discharged Condition: stable  Procedure: Right total knee  Hospital Course: The patient underwent the same day surgery total knee protocol tolerated it well and was discharged home   Discharge Exam: BP 118/71   Pulse 70   Temp 97.9 F (36.6 C)   Resp 15   SpO2 97%      Disposition: Discharge disposition: 01-Home or Self Care       Discharge Instructions    CPM   Complete by: As directed    Continuous passive motion machine (CPM):      Use the CPM from 0 to 60 for 6 hours per day.      You may increase by 10 per day.  You may break it up into 2 or 3 sessions per day.      Use CPM for 2 weeks or until you are told to stop.   Call MD / Call 911   Complete by: As directed    If you experience chest pain or shortness of breath, CALL 911 and be transported to the hospital emergency room.  If you develope a fever above 101 F, pus (white drainage) or increased drainage or redness at the wound, or calf pain, call your surgeon's office.   Constipation Prevention   Complete by: As directed    Drink plenty of fluids.  Prune juice may be helpful.  You may use a stool softener, such as Colace (over the counter) 100 mg twice a day.  Use MiraLax (over the counter) for constipation as needed.   Diet - low sodium heart healthy   Complete by: As directed    Discharge instructions   Complete by: As directed    1. Bone foam 3 x a day for 30 min   2. Exercises as taught by therapist   3. Ice 4 x a day and more if needed   4. Dressing can stay on   Do not put a pillow under the knee. Place it under the heel.   Complete by: As directed    Increase activity slowly as tolerated   Complete by: As directed    TED hose   Complete by: As directed     Use stockings (TED hose) for 2 weeks on both  leg(s).  You may remove them at night for sleeping.     Allergies as of 10/12/2020   No Known Allergies     Medication List    STOP taking these medications   benzonatate 100 MG capsule Commonly known as: TESSALON   cetirizine 10 MG tablet Commonly known as: ZyrTEC Allergy   diclofenac 50 MG tablet Commonly known as: CATAFLAM   promethazine-dextromethorphan 6.25-15 MG/5ML syrup Commonly known as: PROMETHAZINE-DM   pseudoephedrine 60 MG tablet Commonly known as: SUDAFED   traMADol-acetaminophen 37.5-325 MG tablet Commonly known as: ULTRACET     TAKE these medications   alfuzosin 10 MG 24 hr tablet Commonly known as: UROXATRAL Take 10 mg by mouth every evening.   amLODipine 10 MG tablet Commonly known as: NORVASC Take 10 mg by mouth daily.   aspirin 81 MG tablet Take 81 mg by mouth daily.   atorvastatin 80 MG tablet Commonly known as: LIPITOR Take 40 mg by mouth daily.   celecoxib 200 MG capsule Commonly known as: CELEBREX  Take 1 capsule (200 mg total) by mouth 2 (two) times daily.   finasteride 5 MG tablet Commonly known as: PROSCAR Take 5 mg by mouth every evening.   gabapentin 100 MG capsule Commonly known as: Neurontin Take 1 capsule (100 mg total) by mouth 3 (three) times daily for 21 days.   HYDROcodone-acetaminophen 7.5-325 MG tablet Commonly known as: NORCO Take 1 tablet by mouth every 4 (four) hours as needed for up to 7 days for severe pain (pain score 7-10).   Jardiance 10 MG Tabs tablet Generic drug: empagliflozin Take 10 mg by mouth daily.   Lantus SoloStar 100 UNIT/ML Solostar Pen Generic drug: insulin glargine Inject 30 Units into the skin 2 (two) times daily.   lisinopril 40 MG tablet Commonly known as: ZESTRIL Take 40 mg by mouth daily.   metFORMIN 1000 MG tablet Commonly known as: GLUCOPHAGE Take 1,000 mg by mouth 2 (two) times daily with a meal.   methocarbamol 500 MG  tablet Commonly known as: ROBAXIN Take 1 tablet (500 mg total) by mouth every 6 (six) hours as needed for muscle spasms.   senna-docusate 8.6-50 MG tablet Commonly known as: Senokot-S Take 1 tablet by mouth at bedtime as needed for mild constipation.       Follow-up Information    Vickki Hearing, MD On 10/20/2020.   Specialties: Orthopedic Surgery, Radiology Contact information: [address] [place] [address] [phone]               Signed: Fuller Canada 10/19/2020, 9:49 AM

## 2020-10-20 ENCOUNTER — Telehealth: Payer: Self-pay | Admitting: Orthopedic Surgery

## 2020-10-20 NOTE — Telephone Encounter (Signed)
Bandage stays until follow up I called him to advise. He voiced understanding. He asked about ice, we also discussed this

## 2020-10-20 NOTE — Telephone Encounter (Signed)
Chris Wade called to verify his appointment.  He also asked if he is take off the bandages.  I told him that I would have the staff speak with Dr. Romeo Apple regarding this and they would give him a call back.  Would you speak with Dr. Romeo Apple to get his input and then call the patient?  Thanks

## 2020-10-27 ENCOUNTER — Ambulatory Visit (INDEPENDENT_AMBULATORY_CARE_PROVIDER_SITE_OTHER): Payer: 59 | Admitting: Orthopedic Surgery

## 2020-10-27 ENCOUNTER — Other Ambulatory Visit: Payer: Self-pay

## 2020-10-27 ENCOUNTER — Encounter: Payer: Self-pay | Admitting: Orthopedic Surgery

## 2020-10-27 DIAGNOSIS — Z96651 Presence of right artificial knee joint: Secondary | ICD-10-CM

## 2020-10-27 MED ORDER — CELECOXIB 200 MG PO CAPS: 200.0000 mg | ORAL_CAPSULE | Freq: Two times a day (BID) | ORAL | 0 refills | Status: DC

## 2020-10-27 MED ORDER — METHOCARBAMOL 500 MG PO TABS: 500.0000 mg | ORAL_TABLET | Freq: Four times a day (QID) | ORAL | 3 refills | Status: AC | PRN

## 2020-10-27 MED ORDER — GABAPENTIN 100 MG PO CAPS: 100.0000 mg | ORAL_CAPSULE | Freq: Three times a day (TID) | ORAL | 0 refills | Status: DC

## 2020-10-27 MED ORDER — HYDROCODONE-ACETAMINOPHEN 5-325 MG PO TABS: 1.0000 | ORAL_TABLET | Freq: Four times a day (QID) | ORAL | 0 refills | Status: DC | PRN

## 2020-10-27 NOTE — Progress Notes (Signed)
POST OP VISIT   Chief Complaint  Patient presents with  . Post-op Follow-up    Right knee/ wants to drive and wants to go into basement how much longer ? 10 steps     Encounter Diagnosis  Name Primary?  . 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 male  PRE-OPERATIVE DIAGNOSIS:  right knee osteoarthritis  POST-OPERATIVE DIAGNOSIS:  right knee osteoarthritis  PROCEDURE:  Procedure(s): TOTAL KNEE ARTHROPLASTY (Right)   IMPLANTS: DEPUY ATTUNE FB PS  56F 8T 38P 5 POLY    INFO: His wound looks good he has extension just about to 0 and about 80 degrees of flexion he is walking with a walker his pain is well controlled with hydrocodone and the additional medications he is using his CPM   ASSESSMENT AND PLAN   2 weeks postop progressing well continue medications listed below  Start therapy  Drive in 2 weeks   Meds ordered this encounter  Medications  . HYDROcodone-acetaminophen (NORCO/VICODIN) 5-325 MG tablet    Sig: Take 1 tablet by mouth every 6 (six) hours as needed for up to 7 days for moderate pain.    Dispense:  28 tablet    Refill:  0  . methocarbamol (ROBAXIN) 500 MG tablet    Sig: Take 1 tablet (500 mg total) by mouth every 6 (six) hours as needed for muscle spasms.    Dispense:  28 tablet    Refill:  3  . celecoxib (CELEBREX) 200 MG capsule    Sig: Take 1 capsule (200 mg total) by mouth 2 (two) times daily.    Dispense:  28 capsule    Refill:  0  . gabapentin (NEURONTIN) 100 MG capsule    Sig: Take 1 capsule (100 mg total) by mouth 3 (three) times daily for 21 days.    Dispense:  63 capsule    Refill:  0   3 weeks follow-up

## 2020-10-28 ENCOUNTER — Telehealth: Payer: Self-pay

## 2020-10-28 ENCOUNTER — Encounter (HOSPITAL_COMMUNITY): Payer: Self-pay | Admitting: Physical Therapy

## 2020-10-28 ENCOUNTER — Ambulatory Visit (HOSPITAL_COMMUNITY): Payer: Medicare Other | Attending: Orthopedic Surgery | Admitting: Physical Therapy

## 2020-10-28 DIAGNOSIS — M25561 Pain in right knee: Secondary | ICD-10-CM | POA: Diagnosis present

## 2020-10-28 DIAGNOSIS — R2689 Other abnormalities of gait and mobility: Secondary | ICD-10-CM | POA: Insufficient documentation

## 2020-10-28 DIAGNOSIS — M6281 Muscle weakness (generalized): Secondary | ICD-10-CM

## 2020-10-28 DIAGNOSIS — R29898 Other symptoms and signs involving the musculoskeletal system: Secondary | ICD-10-CM | POA: Diagnosis present

## 2020-10-28 DIAGNOSIS — M25661 Stiffness of right knee, not elsewhere classified: Secondary | ICD-10-CM

## 2020-10-28 DIAGNOSIS — Z96651 Presence of right artificial knee joint: Secondary | ICD-10-CM

## 2020-10-28 NOTE — Telephone Encounter (Signed)
pended

## 2020-10-28 NOTE — Telephone Encounter (Signed)
[facility] IN [place] AND [place] ARE OUT OF HYDROCODONE. IT IS ON BACK ORDER.   This patient needs his Prescription sent to [facility]   Hydrocodone-Acetaminophen 5/325 mg  Qty 28 Tablets Take 1 tablet by mouth every 6 (six) hours as needed for up to 7 days for moderate pain

## 2020-10-28 NOTE — Patient Instructions (Signed)
Access Code: [email or web address] URL: [email or web address] Date: 10/28/2020 Prepared by: Greig Castilla Joseph Bias  Exercises Long Sitting Quad Set with [employer] Under Heel - 5 x daily - 7 x weekly - 10 reps - 5-10 second hold

## 2020-10-28 NOTE — Therapy (Addendum)
[facility] [address] [place], [place], [address] Phone: [phone]   Fax:  [phone]  Physical Therapy Evaluation  Patient Details  Name: Chris Wade MRN: [id number] Date of Birth: 09/03/1950 Referring Provider (PT): Fuller Canada MD   RIGHT KNEE ROM: lacking 17 to 95 degrees AMBULATION DISTANCE: 195 feet in    Encounter Date: 10/28/2020   PT End of Session - 10/28/20 1037    Visit Number 1    Number of Visits 12    Date for PT Re-Evaluation 12/09/20    Authorization Type Primary: [employer] (vl 60, 0 used) Secondary Medicare    Authorization - Visit Number 1    Authorization - Number of Visits 60    Progress Note Due on Visit 10    PT Start Time 1040    PT Stop Time 1120    PT Time Calculation (min) 40 min    Activity Tolerance Patient tolerated treatment well    Behavior During Therapy WFL for tasks assessed/performed           Past Medical History:  Diagnosis Date  . Diabetes mellitus without complication (HCC)   . Hypercholesteremia   . Hypertension   . Shortness of breath     Past Surgical History:  Procedure Laterality Date  . BACK SURGERY     ruptured disc  . PROSTATE BIOPSY N/A 01/20/2014   Procedure: PROSTATE BIOPSY;  Surgeon: Ky Barban, MD;  Location: [facility];  Service: Urology;  Laterality: N/A;  . THYROIDECTOMY    . TOTAL KNEE ARTHROPLASTY Right 10/12/2020   Procedure: TOTAL KNEE ARTHROPLASTY;  Surgeon: Vickki Hearing, MD;  Location: [facility];  Service: Orthopedics;  Laterality: Right;    There were no vitals filed for this visit.    Subjective Assessment - 10/28/20 1039    Subjective Patient is a 70 y.o. male who presents to physical therapy s/p R TKA on 10/12/20. He had HHPT and they gave him some exercises that he still does. He feels like his knee is coming along. He has been having trouble with walking, stairs, bending, swelling. He feels a lot of tightness. He  normally cuts grass on a riding mower. His knee feels achy. His main goal is to get back to normal.    Limitations House hold activities;Walking;Standing;Lifting    How long can you walk comfortably? 10-15 minutes    Patient Stated Goals Get back to normal    Currently in Pain? Yes    Pain Score 2     Pain Location Knee    Pain Orientation Right    Pain Descriptors / Indicators Aching;Tightness    Pain Type Surgical pain    Pain Onset 1 to 4 weeks ago    Pain Frequency Constant              [facility] PT Assessment - 10/28/20 0001      Assessment   Medical Diagnosis s/p R TKA    Referring Provider (PT) Fuller Canada MD    Onset Date/Surgical Date 10/12/20    Next MD Visit 3 weeks    Prior Therapy none      Precautions   Precautions None      Restrictions   Weight Bearing Restrictions No      Balance Screen   Has the patient fallen in the past 6 months No    Has the patient had a decrease in activity level because of a fear  of falling?  No    Is the patient reluctant to leave their home because of a fear of falling?  No      Prior Function   Level of Independence Independent    Vocation Retired    [employer]afferVocation Requirements Mows grass      Cognition   Overall Cognitive Status Within Functional Limits for tasks assessed      Observation/Other Assessments   Observations Ambulates with SPC, antalgic gait, bandages on knee, surgical incison appears to be healing well    Focus on Therapeutic Outcomes (FOTO)  38% function      ROM / Strength   AROM / PROM / Strength AROM;Strength      AROM   AROM Assessment Site Knee    Right/Left Knee Right;Left    Right Knee Extension 17   lacking   Right Knee Flexion 96    Left Knee Extension 0    Left Knee Flexion 121      Strength   Strength Assessment Site Hip;Knee;Ankle    Right/Left Hip Right;Left    Right Hip Flexion 4-/5    Left Hip Flexion 4/5    Right/Left Knee Right;Left    Right Knee Flexion 4+/5    Right Knee  Extension 3+/5    Left Knee Flexion 5/5    Right/Left Ankle Right;Left    Right Ankle Dorsiflexion 5/5    Left Ankle Dorsiflexion 5/5      Palpation   Palpation comment grossly tender throughout R knee      Transfers   Comments relies on hands and LLE with RLE kicked out      Ambulation/Gait   Ambulation/Gait Yes    Ambulation/Gait Assistance 6: Modified independent (Device/Increase time)    Ambulation Distance (Feet) 195 Feet    Assistive device Straight cane    Gait Pattern Right foot flat;Right flexed knee in stance    Ambulation Surface Level;Indoor    Gait velocity decreased    Gait Comments 2MWT, slow labored cadence lacking TKE with antalgic gait                      Objective measurements completed on examination: See above findings.       [facility] Adult PT Treatment/Exercise - 10/28/20 0001      Exercises   Exercises Knee/Hip      Knee/Hip Exercises: Aerobic   Recumbent Bike 5 minutes with cueing for end range flexion holds for ROM      Knee/Hip Exercises: Supine   Quad Sets 10 reps    Quad Sets Limitations 5-10 second holds                  PT Education - 10/28/20 1037    Education Details Patient educated on exam findings, POC, scope of PT, initial HEP, LE positioning at rest, use of bone foam    Person(s) Educated Patient    Methods Explanation;Demonstration;Handout    Comprehension Verbalized understanding;Returned demonstration            PT Short Term Goals - 10/28/20 1128      PT SHORT TERM GOAL #1   Title Patient will be independent with HEP in order to improve functional outcomes.    Time 3    Period Weeks    Status New    Target Date 11/18/20      PT SHORT TERM GOAL #2   Title Patient will report at least 25% improvement in symptoms for  improved quality of life.    Time 3    Period Weeks    Status New    Target Date 11/18/20             PT Long Term Goals - 10/28/20 1129      PT LONG TERM GOAL #1   Title  Patient will report at least 75% improvement in symptoms for improved quality of life.    Time 6    Period Weeks    Status New    Target Date 12/09/20      PT LONG TERM GOAL #2   Title Patient will improve FOTO score by at least 15 points in order to indicate improved tolerance to activity.    Time 6    Period Weeks    Status New    Target Date 12/09/20      PT LONG TERM GOAL #3   Title Patient will be able to complete 5x STS in under 11.4 seconds in order to reduce the risk of falls.    Time 6    Period Weeks    Status New    Target Date 12/09/20      PT LONG TERM GOAL #4   Title Patient will improve ROM for left knee extension/flexion to 5-115 degrees to improve squatting, and other functional mobility.    Time 6    Period Weeks    Status New    Target Date 12/09/20      PT LONG TERM GOAL #5   Title Patient will be able to navigate stairs with reciprocal pattern without compensation in order to demonstrate improved LE strength.    Time 6    Period Weeks    Status New    Target Date 12/09/20                  Plan - 10/28/20 1125    Clinical Impression Statement Patient is a 70 y.o. male who presents to physical therapy s/p R TKA on 10/12/20. He presents with pain limited deficits in R knee strength, ROM, endurance, gait, balance, transfers, and functional mobility with ADL. He is having to modify and restrict ADL as indicated by FOTO score as well as subjective information and objective measures which is affecting overall participation. Patient will benefit from skilled physical therapy in order to improve function and reduce impairment.    Personal Factors and Comorbidities Comorbidity 2;Past/Current Experience;Time since onset of injury/illness/exacerbation    Comorbidities DM, HTN    Examination-Activity Limitations Bend;Squat;Stairs;Stand;Transfers;Locomotion Level;Lift;Carry    Examination-Participation Restrictions Cleaning;Church;Community  Activity;Shop;Volunteer;Driving;Yard Work;Occupation;Meal Prep    Stability/Clinical Decision Making Stable/Uncomplicated    Clinical Decision Making Low    Rehab Potential Good    PT Frequency 2x / week    PT Duration 6 weeks    PT Treatment/Interventions ADLs/Self Care Home Management;Aquatic Therapy;Cryotherapy;Electrical Stimulation;Iontophoresis 4mg /ml Dexamethasone;Moist Heat;Traction;DME Instruction;Gait training;Stair training;Ultrasound;Functional mobility training;Therapeutic activities;Therapeutic exercise;Balance training;Neuromuscular re-education;Patient/family education;Manual techniques;Compression bandaging;Scar mobilization;Passive range of motion;Dry needling;Energy conservation;Splinting;Taping;Spinal Manipulations;Joint Manipulations;Vasopneumatic Device;Orthotic Fit/Training;Manual lymph drainage    PT Next Visit Plan begin with R knee mobility and strengthening with heel slides, quad sets, LAQ, SLR, etc; possibly manual for edema    PT Home Exercise Plan 3/10 quad set, heel prop on blue foam    Consulted and Agree with Plan of Care Patient           Patient will benefit from skilled therapeutic intervention in order to improve the following deficits and impairments:  Abnormal gait,Increased edema,Decreased  activity tolerance,Decreased strength,Pain,Decreased balance,Decreased mobility,Difficulty walking,Decreased range of motion,Improper body mechanics,Impaired flexibility  Visit Diagnosis: Right knee pain, unspecified chronicity - Plan: PT plan of care cert/re-cert  Muscle weakness (generalized) - Plan: PT plan of care cert/re-cert  Other abnormalities of gait and mobility - Plan: PT plan of care cert/re-cert  Stiffness of right knee, not elsewhere classified - Plan: PT plan of care cert/re-cert  Other symptoms and signs involving the musculoskeletal system - Plan: PT plan of care cert/re-cert     Problem List Patient Active Problem List   Diagnosis Date Noted   . S/P total knee arthroplasty, right 10/12/2020  . Primary osteoarthritis of right knee 06/11/2014   3:11 PM, 10/28/20 Wyman Songster PT, DPT Physical Therapist at [facility]   [facility] [address] [place], [place], [address] Phone: [phone]   Fax:  [phone]  Name: Chris Wade MRN: [id number] Date of Birth: 1951/05/11

## 2020-11-01 ENCOUNTER — Other Ambulatory Visit: Payer: Self-pay | Admitting: Orthopedic Surgery

## 2020-11-01 DIAGNOSIS — M1710 Unilateral primary osteoarthritis, unspecified knee: Secondary | ICD-10-CM

## 2020-11-01 DIAGNOSIS — M25561 Pain in right knee: Secondary | ICD-10-CM

## 2020-11-02 ENCOUNTER — Other Ambulatory Visit: Payer: Self-pay | Admitting: Orthopedic Surgery

## 2020-11-02 DIAGNOSIS — Z96651 Presence of right artificial knee joint: Secondary | ICD-10-CM

## 2020-11-02 MED ORDER — HYDROCODONE-ACETAMINOPHEN 5-325 MG PO TABS: 1.0000 | ORAL_TABLET | Freq: Four times a day (QID) | ORAL | 0 refills | Status: AC | PRN

## 2020-11-02 NOTE — Progress Notes (Signed)
Meds ordered this encounter  Medications  . HYDROcodone-acetaminophen (NORCO/VICODIN) 5-325 MG tablet    Sig: Take 1 tablet by mouth every 6 (six) hours as needed for up to 7 days for moderate pain.    Dispense:  28 tablet    Refill:  0   [place] [facility]

## 2020-11-03 ENCOUNTER — Other Ambulatory Visit: Payer: Self-pay

## 2020-11-03 ENCOUNTER — Ambulatory Visit (HOSPITAL_COMMUNITY): Payer: Medicare Other | Admitting: Physical Therapy

## 2020-11-03 DIAGNOSIS — M25661 Stiffness of right knee, not elsewhere classified: Secondary | ICD-10-CM

## 2020-11-03 DIAGNOSIS — R2689 Other abnormalities of gait and mobility: Secondary | ICD-10-CM

## 2020-11-03 DIAGNOSIS — M25561 Pain in right knee: Secondary | ICD-10-CM | POA: Diagnosis not present

## 2020-11-03 DIAGNOSIS — M6281 Muscle weakness (generalized): Secondary | ICD-10-CM

## 2020-11-03 DIAGNOSIS — R29898 Other symptoms and signs involving the musculoskeletal system: Secondary | ICD-10-CM

## 2020-11-03 NOTE — Therapy (Signed)
[facility] [address] [place], [place], [address] Phone: [phone]   Fax:  [phone]  Physical Therapy Treatment  Patient Details  Name: Chris Wade MRN: [id number] Date of Birth: Nov 19, 1950 Referring Provider (PT): Fuller Canada MD   Encounter Date: 11/03/2020   PT End of Session - 11/03/20 0849    Visit Number 2    Number of Visits 12    Date for PT Re-Evaluation 12/09/20    Authorization Type Primary: [employer] (vl 60, 0 used) Secondary Medicare    Authorization - Visit Number 2    Authorization - Number of Visits 60    Progress Note Due on Visit 10    PT Start Time 0830    PT Stop Time 0915    PT Time Calculation (min) 45 min    Activity Tolerance Patient tolerated treatment well    Behavior During Therapy [facility] for tasks assessed/performed           Past Medical History:  Diagnosis Date  . Diabetes mellitus without complication (HCC)   . Hypercholesteremia   . Hypertension   . Shortness of breath     Past Surgical History:  Procedure Laterality Date  . BACK SURGERY     ruptured disc  . PROSTATE BIOPSY N/A 01/20/2014   Procedure: PROSTATE BIOPSY;  Surgeon: Ky Barban, MD;  Location: [facility];  Service: Urology;  Laterality: N/A;  . THYROIDECTOMY    . TOTAL KNEE ARTHROPLASTY Right 10/12/2020   Procedure: TOTAL KNEE ARTHROPLASTY;  Surgeon: Vickki Hearing, MD;  Location: [facility];  Service: Orthopedics;  Laterality: Right;    There were no vitals filed for this visit.   Subjective Assessment - 11/03/20 0830    Subjective Pt states that he is doing the best he can around the house and walks.  He is still having swelling and tightness.  He still ice and occasionally elevates therapist encouraged elevating more.    Limitations House hold activities;Walking;Standing;Lifting    How long can you walk comfortably? 10-15 minutes    Patient Stated Goals Get back to normal    Currently in Pain? Yes     Pain Score 4     Pain Location Knee    Pain Orientation Right    Pain Descriptors / Indicators Tightness    Pain Type Surgical pain    Pain Onset 1 to 4 weeks ago    Aggravating Factors  wt bearing    Pain Relieving Factors meds    Effect of Pain on Daily Activities limits                             [facility] Adult PT Treatment/Exercise - 11/03/20 0001      Exercises   Exercises Knee/Hip      Knee/Hip Exercises: Stretches   Active Hamstring Stretch Limitations 3 x 30"    Quad Stretch --      Knee/Hip Exercises: Aerobic   Recumbent Bike 5 minutes able to complete full revolution on seat 15      Knee/Hip Exercises: Standing   Heel Raises Both;10 reps      Knee/Hip Exercises: Seated   Long Arc Quad Right;10 reps      Knee/Hip Exercises: Supine   Quad Sets 10 reps    Short Arc [employer] Right;10 reps    Short Arc Quad Sets Limitations last 5 therapist lift leg as far as  possible and has pt hold x 5 "    Heel Slides Right;10 reps    Knee Extension Limitations 10    Knee Flexion Limitations 110                    PT Short Term Goals - 11/03/20 0847      PT SHORT TERM GOAL #1   Title Patient will be independent with HEP in order to improve functional outcomes.    Time 3    Period Weeks    Status On-going    Target Date 11/18/20      PT SHORT TERM GOAL #2   Title Patient will report at least 25% improvement in symptoms for improved quality of life.    Time 3    Period Weeks    Status On-going    Target Date 11/18/20             PT Long Term Goals - 11/03/20 0847      PT LONG TERM GOAL #1   Title Patient will report at least 75% improvement in symptoms for improved quality of life.    Time 6    Period Weeks    Status On-going      PT LONG TERM GOAL #2   Title Patient will improve FOTO score by at least 15 points in order to indicate improved tolerance to activity.    Time 6    Period Weeks    Status On-going      PT LONG  TERM GOAL #3   Title Patient will be able to complete 5x STS in under 11.4 seconds in order to reduce the risk of falls.    Time 6    Period Weeks    Status On-going      PT LONG TERM GOAL #4   Title Patient will improve ROM for left knee extension/flexion to 5-115 degrees to improve squatting, and other functional mobility.    Time 6    Period Weeks    Status On-going      PT LONG TERM GOAL #5   Title Patient will be able to navigate stairs with reciprocal pattern without compensation in order to demonstrate improved LE strength.    Time 6    Period Weeks    Status On-going                 Plan - 11/03/20 0849    Clinical Impression Statement Pt ROM has improved to 10-110.  Therapist progressed pt thru new exercises with good tolerance.  Pt needed to take a few short rest breaks but overall completed well. Evaluation and goals reviewed with pt.  Pt will continue to benefit from skilled therapy to imporve ROM< strength and gait mechanics.    Personal Factors and Comorbidities Comorbidity 2;Past/Current Experience;Time since onset of injury/illness/exacerbation    Comorbidities DM, HTN    Examination-Activity Limitations Bend;Squat;Stairs;Stand;Transfers;Locomotion Level;Lift;Carry    Examination-Participation Restrictions Cleaning;Church;Community Activity;Shop;Volunteer;Driving;Yard Work;Occupation;Meal Prep    Stability/Clinical Decision Making Stable/Uncomplicated    Rehab Potential Good    PT Frequency 2x / week    PT Duration 6 weeks    PT Treatment/Interventions ADLs/Self Care Home Management;Aquatic Therapy;Cryotherapy;Electrical Stimulation;Iontophoresis 4mg /ml Dexamethasone;Moist Heat;Traction;DME Instruction;Gait training;Stair training;Ultrasound;Functional mobility training;Therapeutic activities;Therapeutic exercise;Balance training;Neuromuscular re-education;Patient/family education;Manual techniques;Compression bandaging;Scar mobilization;Passive range of  motion;Dry needling;Energy conservation;Splinting;Taping;Spinal Manipulations;Joint Manipulations;Vasopneumatic Device;Orthotic Fit/Training;Manual lymph drainage    PT Next Visit Plan possibly manual for edema begin rocker board, standing terminal knee extension, and knee flexion  PT Home Exercise Plan 3/10 quad set, heel prop on blue foam, heelslide, SLR, LAQm hamstring stretch    Consulted and Agree with Plan of Care Patient           Patient will benefit from skilled therapeutic intervention in order to improve the following deficits and impairments:  Abnormal gait,Increased edema,Decreased activity tolerance,Decreased strength,Pain,Decreased balance,Decreased mobility,Difficulty walking,Decreased range of motion,Improper body mechanics,Impaired flexibility  Visit Diagnosis: Right knee pain, unspecified chronicity  Muscle weakness (generalized)  Other abnormalities of gait and mobility  Stiffness of right knee, not elsewhere classified  Other symptoms and signs involving the musculoskeletal system     Problem List Patient Active Problem List   Diagnosis Date Noted  . S/P total knee arthroplasty, right 10/12/2020  . Primary osteoarthritis of right knee 06/11/2014   Virgina Organ, PT CLT [phone] 11/03/2020, 9:14 AM  [facility] [address] [place], [place], [address] Phone: [phone]   Fax:  [phone]  Name: ORON WESTRUP MRN: [id number] Date of Birth: 11-09-1950

## 2020-11-04 ENCOUNTER — Ambulatory Visit (HOSPITAL_COMMUNITY): Payer: Medicare Other | Admitting: Physical Therapy

## 2020-11-04 DIAGNOSIS — M6281 Muscle weakness (generalized): Secondary | ICD-10-CM

## 2020-11-04 DIAGNOSIS — M25561 Pain in right knee: Secondary | ICD-10-CM | POA: Diagnosis not present

## 2020-11-04 DIAGNOSIS — M25661 Stiffness of right knee, not elsewhere classified: Secondary | ICD-10-CM

## 2020-11-04 DIAGNOSIS — R2689 Other abnormalities of gait and mobility: Secondary | ICD-10-CM

## 2020-11-04 NOTE — Therapy (Signed)
[facility] [address] [place], [place], [address] Phone: [phone]   Fax:  [phone]  Physical Therapy Treatment  Patient Details  Name: Chris Wade MRN: [id number] Date of Birth: [birth year] Referring Provider (PT): Fuller Canada MD   Encounter Date: 11/04/2020   PT End of Session - 11/04/20 1455    Visit Number 3    Number of Visits 12    Date for PT Re-Evaluation 12/09/20    Authorization Type Primary: [employer] (vl 60, 0 used) Secondary Medicare    Authorization - Visit Number 3    Authorization - Number of Visits 60    Progress Note Due on Visit 10    PT Start Time 1318    PT Stop Time 1400    PT Time Calculation (min) 42 min    Activity Tolerance Patient tolerated treatment well    Behavior During Therapy WFL for tasks assessed/performed           Past Medical History:  Diagnosis Date  . Diabetes mellitus without complication (HCC)   . Hypercholesteremia   . Hypertension   . Shortness of breath     Past Surgical History:  Procedure Laterality Date  . BACK SURGERY     ruptured disc  . PROSTATE BIOPSY N/A 01/20/2014   Procedure: PROSTATE BIOPSY;  Surgeon: Ky Barban, MD;  Location: [facility];  Service: Urology;  Laterality: N/A;  . THYROIDECTOMY    . TOTAL KNEE ARTHROPLASTY Right 10/12/2020   Procedure: TOTAL KNEE ARTHROPLASTY;  Surgeon: Vickki Hearing, MD;  Location: [facility];  Service: Orthopedics;  Laterality: Right;    There were no vitals filed for this visit.   Subjective Assessment - 11/04/20 1332    Subjective pt states he is doing well today.  States the swelling and tightness is the worst.    Currently in Pain? Yes    Pain Score 5     Pain Location Knee    Pain Orientation Right    Pain Descriptors / Indicators Aching;Tightness    Pain Type Surgical pain                             [facility] Adult PT Treatment/Exercise - 11/04/20 0001      Ambulation/Gait    Ambulation Distance (Feet) 195 Feet    Assistive device Straight cane    Gait Pattern Right foot flat;Right flexed knee in stance    Ambulation Surface Level;Indoor    Gait velocity decreased      Knee/Hip Exercises: Stretches   Active Hamstring Stretch Right;3 reps;30 seconds    Active Hamstring Stretch Limitations 12"step    Knee: Self-Stretch to increase Flexion Right;10 seconds    Knee: Self-Stretch Limitations 10 reps (warmup since bike was taken)      Knee/Hip Exercises: Aerobic   Recumbent Bike 5 minutes able to complete full revolution on seat 15      Knee/Hip Exercises: Standing   Heel Raises 20 reps    Knee Flexion Right;15 reps      Knee/Hip Exercises: Seated   Long Arc Quad Right;10 reps      Knee/Hip Exercises: Supine   Quad Sets 10 reps    Quad Sets Limitations 5" holds    Short Arc Quad Sets Right;10 reps    Short Arc Quad Sets Limitations puls 5 held by therapist into end ROM and hold 5"    Heel Slides  Right;10 reps    Knee Extension Limitations 8    Knee Flexion Limitations 112      Manual Therapy   Manual Therapy Edema management    Manual therapy comments completed seperate from all other skilled interventions    Edema Management with elevation to reduce edema                    PT Short Term Goals - 11/03/20 0847      PT SHORT TERM GOAL #1   Title Patient will be independent with HEP in order to improve functional outcomes.    Time 3    Period Weeks    Status On-going    Target Date 11/18/20      PT SHORT TERM GOAL #2   Title Patient will report at least 25% improvement in symptoms for improved quality of life.    Time 3    Period Weeks    Status On-going    Target Date 11/18/20             PT Long Term Goals - 11/03/20 0847      PT LONG TERM GOAL #1   Title Patient will report at least 75% improvement in symptoms for improved quality of life.    Time 6    Period Weeks    Status On-going      PT LONG TERM GOAL #2    Title Patient will improve FOTO score by at least 15 points in order to indicate improved tolerance to activity.    Time 6    Period Weeks    Status On-going      PT LONG TERM GOAL #3   Title Patient will be able to complete 5x STS in under 11.4 seconds in order to reduce the risk of falls.    Time 6    Period Weeks    Status On-going      PT LONG TERM GOAL #4   Title Patient will improve ROM for left knee extension/flexion to 5-115 degrees to improve squatting, and other functional mobility.    Time 6    Period Weeks    Status On-going      PT LONG TERM GOAL #5   Title Patient will be able to navigate stairs with reciprocal pattern without compensation in order to demonstrate improved LE strength.    Time 6    Period Weeks    Status On-going                 Plan - 11/04/20 1500    Clinical Impression Statement Began session with knee flexion stretch as bike was unavailable.  Worked on ambulation eliminating antalgia using SPC.   Added standing knee flexion and worked on quad strength deficit with therapist assist into end ROM.  Manual added this session to reduce edema and also to reduce adhesions perimeter of knee.  Resultant AROM at EOS was 8-112.  Encouraged pt to complete heel prop to work on knee extension.    Personal Factors and Comorbidities Comorbidity 2;Past/Current Experience;Time since onset of injury/illness/exacerbation    Comorbidities DM, HTN    Examination-Activity Limitations Bend;Squat;Stairs;Stand;Transfers;Locomotion Level;Lift;Carry    Examination-Participation Restrictions Cleaning;Church;Community Activity;Shop;Volunteer;Driving;Yard Work;Occupation;Meal Prep    Stability/Clinical Decision Making Stable/Uncomplicated    Rehab Potential Good    PT Frequency 2x / week    PT Duration 6 weeks    PT Treatment/Interventions ADLs/Self Care Home Management;Aquatic Therapy;Cryotherapy;Electrical Stimulation;Iontophoresis 4mg /ml Dexamethasone;Moist  Heat;Traction;DME Instruction;Gait training;Stair training;Ultrasound;Functional  mobility training;Therapeutic activities;Therapeutic exercise;Balance training;Neuromuscular re-education;Patient/family education;Manual techniques;Compression bandaging;Scar mobilization;Passive range of motion;Dry needling;Energy conservation;Splinting;Taping;Spinal Manipulations;Joint Manipulations;Vasopneumatic Device;Orthotic Fit/Training;Manual lymph drainage    PT Next Visit Plan Next session begin rocker board and standing terminal knee extension.  Possibly PROM and continue with manual as needed.    PT Home Exercise Plan 3/10 quad set, heel prop on blue foam, heelslide, SLR, LAQm hamstring stretch    Consulted and Agree with Plan of Care Patient           Patient will benefit from skilled therapeutic intervention in order to improve the following deficits and impairments:  Abnormal gait,Increased edema,Decreased activity tolerance,Decreased strength,Pain,Decreased balance,Decreased mobility,Difficulty walking,Decreased range of motion,Improper body mechanics,Impaired flexibility  Visit Diagnosis: Muscle weakness (generalized)  Other abnormalities of gait and mobility  Stiffness of right knee, not elsewhere classified  Right knee pain, unspecified chronicity     Problem List Patient Active Problem List   Diagnosis Date Noted  . S/P total knee arthroplasty, right 10/12/2020  . Primary osteoarthritis of right knee 06/11/2014   Lurena Nida, PTA/CLT [phone]  Lurena Nida 11/04/2020, 3:01 PM  [facility] [address] [place], [place], [address] Phone: [phone]   Fax:  [phone]  Name: Chris Wade MRN: [id number] Date of Birth: 03-04-1951

## 2020-11-10 ENCOUNTER — Encounter (HOSPITAL_COMMUNITY): Payer: 59 | Admitting: Physical Therapy

## 2020-11-11 ENCOUNTER — Ambulatory Visit (HOSPITAL_COMMUNITY): Payer: Medicare Other

## 2020-11-11 ENCOUNTER — Encounter (HOSPITAL_COMMUNITY): Payer: Self-pay

## 2020-11-11 ENCOUNTER — Other Ambulatory Visit: Payer: Self-pay

## 2020-11-11 DIAGNOSIS — M25661 Stiffness of right knee, not elsewhere classified: Secondary | ICD-10-CM

## 2020-11-11 DIAGNOSIS — M25561 Pain in right knee: Secondary | ICD-10-CM

## 2020-11-11 DIAGNOSIS — R29898 Other symptoms and signs involving the musculoskeletal system: Secondary | ICD-10-CM

## 2020-11-11 DIAGNOSIS — M6281 Muscle weakness (generalized): Secondary | ICD-10-CM

## 2020-11-11 DIAGNOSIS — R2689 Other abnormalities of gait and mobility: Secondary | ICD-10-CM

## 2020-11-11 NOTE — Therapy (Signed)
[facility] [address] [place], [place], [address] Phone: [phone]   Fax:  [phone]  Physical Therapy Treatment  Patient Details  Name: Chris Wade MRN: [id number] Date of Birth: 09/19/1950 Referring Provider (PT): Fuller Canada MD   Encounter Date: 11/11/2020   PT End of Session - 11/11/20 0925    Visit Number 4    Number of Visits 12    Date for PT Re-Evaluation 12/09/20    Authorization Type Primary: [employer] (vl 60, 0 used) Secondary Medicare    Authorization - Visit Number 4    Authorization - Number of Visits 60    Progress Note Due on Visit 10    PT Start Time [phone]    PT Stop Time 0957    PT Time Calculation (min) 40 min    Activity Tolerance Patient tolerated treatment well    Behavior During Therapy [facility] for tasks assessed/performed           Past Medical History:  Diagnosis Date  . Diabetes mellitus without complication (HCC)   . Hypercholesteremia   . Hypertension   . Shortness of breath     Past Surgical History:  Procedure Laterality Date  . BACK SURGERY     ruptured disc  . PROSTATE BIOPSY N/A 01/20/2014   Procedure: PROSTATE BIOPSY;  Surgeon: Ky Barban, MD;  Location: [facility];  Service: Urology;  Laterality: N/A;  . THYROIDECTOMY    . TOTAL KNEE ARTHROPLASTY Right 10/12/2020   Procedure: TOTAL KNEE ARTHROPLASTY;  Surgeon: Vickki Hearing, MD;  Location: [facility];  Service: Orthopedics;  Laterality: Right;    There were no vitals filed for this visit.   Subjective Assessment - 11/11/20 0924    Subjective Pt stated knee is tight today.  Continues to have some swelling, has been elevating at home.    Currently in Pain? No/denies              [facility] PT Assessment - 11/11/20 0001      Assessment   Medical Diagnosis s/p R TKA    Referring Provider (PT) Fuller Canada MD    Onset Date/Surgical Date 10/12/20    Next MD Visit 11/17/20                          [facility] Adult PT Treatment/Exercise - 11/11/20 0001      Exercises   Exercises Knee/Hip      Knee/Hip Exercises: Stretches   Active Hamstring Stretch Right;3 reps;30 seconds    Active Hamstring Stretch Limitations 12"step    Knee: Self-Stretch to increase Flexion Right;10 seconds    Knee: Self-Stretch Limitations 12in step height for knee flexion      Knee/Hip Exercises: Aerobic   Recumbent Bike 5 minutes able to complete full revolution on seat 15      Knee/Hip Exercises: Standing   Heel Raises 20 reps    Heel Raises Limitations incline slope    Terminal Knee Extension 10 reps;Theraband    Theraband Level (Terminal Knee Extension) Level 2 (Red)    Terminal Knee Extension Limitations 5" holds    Rocker Board 2 minutes    Rocker Board Limitations lateral and DF/PF      Knee/Hip Exercises: Supine   Quad Sets 10 reps    Quad Sets Limitations 5" holds    Heel Slides Right;5 reps    Knee Extension AROM    Knee Extension Limitations 7  Knee Flexion AROM    Knee Flexion Limitations 114      Manual Therapy   Manual Therapy Edema management    Manual therapy comments completed seperate from all other skilled interventions    Edema Management with elevation to reduce edema                    PT Short Term Goals - 11/03/20 0847      PT SHORT TERM GOAL #1   Title Patient will be independent with HEP in order to improve functional outcomes.    Time 3    Period Weeks    Status On-going    Target Date 11/18/20      PT SHORT TERM GOAL #2   Title Patient will report at least 25% improvement in symptoms for improved quality of life.    Time 3    Period Weeks    Status On-going    Target Date 11/18/20             PT Long Term Goals - 11/03/20 0847      PT LONG TERM GOAL #1   Title Patient will report at least 75% improvement in symptoms for improved quality of life.    Time 6    Period Weeks    Status On-going      PT LONG  TERM GOAL #2   Title Patient will improve FOTO score by at least 15 points in order to indicate improved tolerance to activity.    Time 6    Period Weeks    Status On-going      PT LONG TERM GOAL #3   Title Patient will be able to complete 5x STS in under 11.4 seconds in order to reduce the risk of falls.    Time 6    Period Weeks    Status On-going      PT LONG TERM GOAL #4   Title Patient will improve ROM for left knee extension/flexion to 5-115 degrees to improve squatting, and other functional mobility.    Time 6    Period Weeks    Status On-going      PT LONG TERM GOAL #5   Title Patient will be able to navigate stairs with reciprocal pattern without compensation in order to demonstrate improved LE strength.    Time 6    Period Weeks    Status On-going                 Plan - 11/11/20 1120    Clinical Impression Statement Added rockerboard to equalize weight bearing during gait.  Session focus on knee mobility wiht additional standing TKE.  EOS with manual to address edema for pain control and to assist with ROM.  Pt encouraged to complete quad sets periodically through the day,  AROM 7-114 degrees.    Personal Factors and Comorbidities Comorbidity 2;Past/Current Experience;Time since onset of injury/illness/exacerbation    Comorbidities DM, HTN    Examination-Activity Limitations Bend;Squat;Stairs;Stand;Transfers;Locomotion Level;Lift;Carry    Examination-Participation Restrictions Cleaning;Church;Community Activity;Shop;Volunteer;Driving;Yard Work;Occupation;Meal Prep    Stability/Clinical Decision Making Stable/Uncomplicated    Clinical Decision Making Low    Rehab Potential Good    PT Frequency 2x / week    PT Duration 6 weeks    PT Treatment/Interventions ADLs/Self Care Home Management;Aquatic Therapy;Cryotherapy;Electrical Stimulation;Iontophoresis 4mg /ml Dexamethasone;Moist Heat;Traction;DME Instruction;Gait training;Stair training;Ultrasound;Functional mobility  training;Therapeutic activities;Therapeutic exercise;Balance training;Neuromuscular re-education;Patient/family education;Manual techniques;Compression bandaging;Scar mobilization;Passive range of motion;Dry needling;Energy conservation;Splinting;Taping;Spinal Manipulations;Joint Manipulations;Vasopneumatic Device;Orthotic Fit/Training;Manual lymph drainage  PT Next Visit Plan Add supine TKE and prone knee hang if able to tolerate prone position.  Progress note on 3/30 prior MD apt.  Possibly PROM and continue with manual as needed.    PT Home Exercise Plan 3/10 quad set, heel prop on blue foam, heelslide, SLR, LAQm hamstring stretch    Consulted and Agree with Plan of Care Patient           Patient will benefit from skilled therapeutic intervention in order to improve the following deficits and impairments:  Abnormal gait,Increased edema,Decreased activity tolerance,Decreased strength,Pain,Decreased balance,Decreased mobility,Difficulty walking,Decreased range of motion,Improper body mechanics,Impaired flexibility  Visit Diagnosis: Other abnormalities of gait and mobility  Right knee pain, unspecified chronicity  Other symptoms and signs involving the musculoskeletal system  Stiffness of right knee, not elsewhere classified  Muscle weakness (generalized)     Problem List Patient Active Problem List   Diagnosis Date Noted  . S/P total knee arthroplasty, right 10/12/2020  . Primary osteoarthritis of right knee 06/11/2014   Becky Sax, LPTA/CLT; CBIS [phone]  Juel Burrow 11/11/2020, 11:25 AM  [facility] [address] [place], [place], [address] Phone: [phone]   Fax:  [phone]  Name: JUQUAN REZNICK MRN: [id number] Date of Birth: 04/06/1951

## 2020-11-12 ENCOUNTER — Encounter (HOSPITAL_COMMUNITY): Payer: 59

## 2020-11-16 ENCOUNTER — Other Ambulatory Visit: Payer: Self-pay

## 2020-11-16 ENCOUNTER — Ambulatory Visit (HOSPITAL_COMMUNITY): Payer: Medicare Other

## 2020-11-16 ENCOUNTER — Encounter (HOSPITAL_COMMUNITY): Payer: Self-pay

## 2020-11-16 DIAGNOSIS — M25561 Pain in right knee: Secondary | ICD-10-CM | POA: Diagnosis not present

## 2020-11-16 DIAGNOSIS — M25661 Stiffness of right knee, not elsewhere classified: Secondary | ICD-10-CM

## 2020-11-16 DIAGNOSIS — M6281 Muscle weakness (generalized): Secondary | ICD-10-CM

## 2020-11-16 DIAGNOSIS — R29898 Other symptoms and signs involving the musculoskeletal system: Secondary | ICD-10-CM

## 2020-11-16 DIAGNOSIS — R2689 Other abnormalities of gait and mobility: Secondary | ICD-10-CM

## 2020-11-16 NOTE — Therapy (Signed)
[facility] [address] [place], [place], [address] Phone: [phone]   Fax:  [phone]  Physical Therapy Treatment  Patient Details  Name: Chris Wade MRN: [id number] Date of Birth: 1951/03/29 Referring Provider (PT): Fuller Canada MD   Encounter Date: 11/16/2020   PT End of Session - 11/16/20 1007    Visit Number 5    Number of Visits 12    Date for PT Re-Evaluation 12/09/20    Authorization Type Primary: [employer] (vl 60, 0 used) Secondary Medicare    Authorization - Visit Number 5    Authorization - Number of Visits 60    Progress Note Due on Visit 10    PT Start Time 1002   late arrival   PT Stop Time 1030    PT Time Calculation (min) 28 min    Activity Tolerance Patient tolerated treatment well    Behavior During Therapy [facility] for tasks assessed/performed           Past Medical History:  Diagnosis Date  . Diabetes mellitus without complication (HCC)   . Hypercholesteremia   . Hypertension   . Shortness of breath     Past Surgical History:  Procedure Laterality Date  . BACK SURGERY     ruptured disc  . PROSTATE BIOPSY N/A 01/20/2014   Procedure: PROSTATE BIOPSY;  Surgeon: Ky Barban, MD;  Location: [facility];  Service: Urology;  Laterality: N/A;  . THYROIDECTOMY    . TOTAL KNEE ARTHROPLASTY Right 10/12/2020   Procedure: TOTAL KNEE ARTHROPLASTY;  Surgeon: Vickki Hearing, MD;  Location: [facility];  Service: Orthopedics;  Laterality: Right;    There were no vitals filed for this visit.   Subjective Assessment - 11/16/20 1013    Subjective Pt reports knee is stiff and he hasn't been completing HEP like he should    Currently in Pain? Yes    Pain Score 4     Pain Location Knee    Pain Orientation Right    Pain Descriptors / Indicators Aching;Tightness    Pain Type Surgical pain              [facility] PT Assessment - 11/16/20 0001      Assessment   Medical Diagnosis s/p R TKA    Referring  Provider (PT) Fuller Canada MD    Onset Date/Surgical Date 10/12/20    Next MD Visit 11/17/20                         [facility] Adult PT Treatment/Exercise - 11/16/20 0001      Ambulation/Gait   Stairs Yes    Stairs Assistance 5: Supervision    Stair Management Technique One rail Left;Step to pattern    Number of Stairs 10    Height of Stairs -2      Knee/Hip Exercises: Stretches   Tax inspector reps;60 seconds    Lobbyist Limitations with belt/strap      Knee/Hip Exercises: Aerobic   Recumbent Bike 5 minutes able to complete full revolution on seat 15      Knee/Hip Exercises: Standing   Functional Squat 2 sets;5 reps    Functional Squat Limitations counter support    Rocker Board 2 minutes    Rocker Board Limitations lateral and DF/PF      Knee/Hip Exercises: Supine   Quad Sets Strengthening;Right;2 sets;10 reps    Heel Slides AAROM;Right;3 sets;5 reps  Knee Extension AROM    Knee Extension Limitations 5   lacking   Knee Flexion AROM    Knee Flexion Limitations 112                  PT Education - 11/16/20 1014    Education Details education on HEP completion    Person(s) Educated Patient    Methods Explanation    Comprehension Verbalized understanding            PT Short Term Goals - 11/03/20 0847      PT SHORT TERM GOAL #1   Title Patient will be independent with HEP in order to improve functional outcomes.    Time 3    Period Weeks    Status On-going    Target Date 11/18/20      PT SHORT TERM GOAL #2   Title Patient will report at least 25% improvement in symptoms for improved quality of life.    Time 3    Period Weeks    Status On-going    Target Date 11/18/20             PT Long Term Goals - 11/03/20 0847      PT LONG TERM GOAL #1   Title Patient will report at least 75% improvement in symptoms for improved quality of life.    Time 6    Period Weeks    Status On-going      PT LONG TERM GOAL #2   Title  Patient will improve FOTO score by at least 15 points in order to indicate improved tolerance to activity.    Time 6    Period Weeks    Status On-going      PT LONG TERM GOAL #3   Title Patient will be able to complete 5x STS in under 11.4 seconds in order to reduce the risk of falls.    Time 6    Period Weeks    Status On-going      PT LONG TERM GOAL #4   Title Patient will improve ROM for left knee extension/flexion to 5-115 degrees to improve squatting, and other functional mobility.    Time 6    Period Weeks    Status On-going      PT LONG TERM GOAL #5   Title Patient will be able to navigate stairs with reciprocal pattern without compensation in order to demonstrate improved LE strength.    Time 6    Period Weeks    Status On-going                 Plan - 11/16/20 1015    Clinical Impression Statement Tolerating tx sessions well and demo good performance of stair ambulation with step-to pattern and AD with good safety awareness. reinforced benefits of frequent HEP completion to reduce stiffness and improve knee function. Continued PT POC indicated to normalize gait and squat mechanics    Personal Factors and Comorbidities Comorbidity 2;Past/Current Experience;Time since onset of injury/illness/exacerbation    Comorbidities DM, HTN    Examination-Activity Limitations Bend;Squat;Stairs;Stand;Transfers;Locomotion Level;Lift;Carry    Examination-Participation Restrictions Cleaning;Church;Community Activity;Shop;Volunteer;Driving;Yard Work;Occupation;Meal Prep    Stability/Clinical Decision Making Stable/Uncomplicated    Rehab Potential Good    PT Frequency 2x / week    PT Duration 6 weeks    PT Treatment/Interventions ADLs/Self Care Home Management;Aquatic Therapy;Cryotherapy;Electrical Stimulation;Iontophoresis 4mg /ml Dexamethasone;Moist Heat;Traction;DME Instruction;Gait training;Stair training;Ultrasound;Functional mobility training;Therapeutic activities;Therapeutic  exercise;Balance training;Neuromuscular re-education;Patient/family education;Manual techniques;Compression bandaging;Scar mobilization;Passive range of motion;Dry needling;Energy conservation;Splinting;Taping;Spinal  Manipulations;Joint Manipulations;Vasopneumatic Device;Orthotic Fit/Training;Manual lymph drainage    PT Next Visit Plan Add supine TKE and prone knee hang if able to tolerate prone position.  Progress note on 3/30 prior MD apt.  Possibly PROM and continue with manual as needed.    PT Home Exercise Plan 3/10 quad set, heel prop on blue foam, heelslide, SLR, LAQm hamstring stretch    Consulted and Agree with Plan of Care Patient           Patient will benefit from skilled therapeutic intervention in order to improve the following deficits and impairments:  Abnormal gait,Increased edema,Decreased activity tolerance,Decreased strength,Pain,Decreased balance,Decreased mobility,Difficulty walking,Decreased range of motion,Improper body mechanics,Impaired flexibility  Visit Diagnosis: Other abnormalities of gait and mobility  Right knee pain, unspecified chronicity  Other symptoms and signs involving the musculoskeletal system  Stiffness of right knee, not elsewhere classified  Muscle weakness (generalized)     Problem List Patient Active Problem List   Diagnosis Date Noted  . S/P total knee arthroplasty, right 10/12/2020  . Primary osteoarthritis of right knee 06/11/2014   10:32 AM, 11/16/20 M. Shary Decamp, PT, DPT Physical Therapist- [place] Office Number: [phone]  [facility] [address] [place], [place], [address] Phone: [phone]   Fax:  [phone]  Name: LEVANDER KATZENSTEIN MRN: [id number] Date of Birth: 1951-06-20

## 2020-11-16 NOTE — Patient Instructions (Signed)
Access Code: [email or web address] URL: [email or web address] Date: 11/16/2020 Prepared by: Shary Decamp  Exercises Prone Quadriceps Stretch with Strap - 1 x daily - 7 x weekly - 3 sets - 5 reps - 30-60 sec hold Squat with Counter Support - 1 x daily - 7 x weekly - 3 sets - 5 reps

## 2020-11-17 ENCOUNTER — Encounter: Payer: Self-pay | Admitting: Orthopedic Surgery

## 2020-11-17 ENCOUNTER — Ambulatory Visit (INDEPENDENT_AMBULATORY_CARE_PROVIDER_SITE_OTHER): Payer: 59 | Admitting: Orthopedic Surgery

## 2020-11-17 ENCOUNTER — Ambulatory Visit (HOSPITAL_COMMUNITY): Payer: Medicare Other

## 2020-11-17 DIAGNOSIS — R2689 Other abnormalities of gait and mobility: Secondary | ICD-10-CM

## 2020-11-17 DIAGNOSIS — R29898 Other symptoms and signs involving the musculoskeletal system: Secondary | ICD-10-CM

## 2020-11-17 DIAGNOSIS — M6281 Muscle weakness (generalized): Secondary | ICD-10-CM

## 2020-11-17 DIAGNOSIS — M25561 Pain in right knee: Secondary | ICD-10-CM

## 2020-11-17 DIAGNOSIS — M25661 Stiffness of right knee, not elsewhere classified: Secondary | ICD-10-CM

## 2020-11-17 DIAGNOSIS — M1711 Unilateral primary osteoarthritis, right knee: Secondary | ICD-10-CM

## 2020-11-17 DIAGNOSIS — M1710 Unilateral primary osteoarthritis, unspecified knee: Secondary | ICD-10-CM

## 2020-11-17 MED ORDER — HYDROCODONE-ACETAMINOPHEN 7.5-325 MG PO TABS
1.0000 | ORAL_TABLET | Freq: Four times a day (QID) | ORAL | 0 refills | Status: DC | PRN
Start: 2020-11-17 — End: 2020-12-15

## 2020-11-17 MED ORDER — TRAMADOL-ACETAMINOPHEN 37.5-325 MG PO TABS: 1.0000 | ORAL_TABLET | Freq: Four times a day (QID) | ORAL | 0 refills | Status: DC | PRN

## 2020-11-17 MED ORDER — CELECOXIB 200 MG PO CAPS: 200.0000 mg | ORAL_CAPSULE | Freq: Two times a day (BID) | ORAL | 0 refills | Status: AC

## 2020-11-17 NOTE — Progress Notes (Signed)
Chief Complaint  Patient presents with  . Post-op Follow-up    Right knee 10/12/20   WEEK 5 POST OP   Right total knee  Patient still having some pain  Some swelling  His range of motion is now 5-1 15 he is walking with a cane has a slight limp  His wound looks good his knee has some swelling in it I do not see any signs of infection  We refilled the Celebrex to take twice a day tramadol to take every 6 hours and then for breakthrough pain some hydrocodone  We will continue therapy and see me in 6 weeks  Meds ordered this encounter  Medications  . HYDROcodone-acetaminophen (NORCO) 7.5-325 MG tablet    Sig: Take 1 tablet by mouth every 6 (six) hours as needed for moderate pain.    Dispense:  28 tablet    Refill:  0  . celecoxib (CELEBREX) 200 MG capsule    Sig: Take 1 capsule (200 mg total) by mouth 2 (two) times daily.    Dispense:  28 capsule    Refill:  0  . traMADol-acetaminophen (ULTRACET) 37.5-325 MG tablet    Sig: Take 1 tablet by mouth every 6 (six) hours as needed for severe pain.    Dispense:  60 tablet    Refill:  0    Not to exceed 5 additional fills before 02/14/2021

## 2020-11-17 NOTE — Therapy (Signed)
[facility] [address] [place], [place], [address] Phone: [phone]   Fax:  [phone]  Physical Therapy Treatment  Patient Details  Name: Chris Wade MRN: [id number] Date of Birth: 1950-10-20 Referring Provider (PT): Fuller Canada MD   Encounter Date: 11/17/2020   PT End of Session - 11/17/20 0913    Visit Number 6    Number of Visits 12    Date for PT Re-Evaluation 12/09/20    Authorization Type Primary: [employer] (vl 60, 0 used) Secondary Medicare    Authorization - Visit Number 6    Authorization - Number of Visits 60    Progress Note Due on Visit 10    PT Start Time 0901    PT Stop Time 0945    PT Time Calculation (min) 44 min    Activity Tolerance Patient tolerated treatment well    Behavior During Therapy [facility] for tasks assessed/performed           Past Medical History:  Diagnosis Date  . Diabetes mellitus without complication (HCC)   . Hypercholesteremia   . Hypertension   . Shortness of breath     Past Surgical History:  Procedure Laterality Date  . BACK SURGERY     ruptured disc  . PROSTATE BIOPSY N/A 01/20/2014   Procedure: PROSTATE BIOPSY;  Surgeon: Ky Barban, MD;  Location: [facility];  Service: Urology;  Laterality: N/A;  . THYROIDECTOMY    . TOTAL KNEE ARTHROPLASTY Right 10/12/2020   Procedure: TOTAL KNEE ARTHROPLASTY;  Surgeon: Vickki Hearing, MD;  Location: [facility];  Service: Orthopedics;  Laterality: Right;    There were no vitals filed for this visit.   Subjective Assessment - 11/17/20 0913    Subjective Right knee is feeling stiff but overall feeling good              [facility] PT Assessment - 11/17/20 0001      Assessment   Medical Diagnosis s/p R TKA    Referring Provider (PT) Fuller Canada MD    Onset Date/Surgical Date 10/12/20    Next MD Visit 11/17/20                         [facility] Adult PT Treatment/Exercise - 11/17/20 0001      Knee/Hip  Exercises: Aerobic   Nustep level 3 x 6 min for dynamic warm-up      Knee/Hip Exercises: Machines for Strengthening   Cybex Knee Flexion 6 plates, 5G38      Knee/Hip Exercises: Standing   Terminal Knee Extension Strengthening;Right;3 sets;10 reps   1x10 2 plates, 7F64 3 plates   Other Standing Knee Exercises knee flexion drive stretch 3P29 sec 5" hold      Knee/Hip Exercises: Seated   Sit to Sand Other (comment);with UE support   3x10 from progressively lower seat height and use of mirror for push-off feedback 24" to 18" seat     Knee/Hip Exercises: Supine   Heel Slides AAROM;Right;3 sets;10 reps    Heel Slides Limitations with sheet to assist, 5 sec hold    Knee Extension AROM;3 sets;10 reps   with heel prop   Knee Extension Limitations 5   lacking   Knee Flexion AROM    Knee Flexion Limitations 115                  PT Education - 11/17/20 0942    Education Details pt education  in techniuqe sto improve RLE wieght acceptance during gait and transfers    Person(s) Educated Patient    Methods Explanation    Comprehension Verbalized understanding            PT Short Term Goals - 11/03/20 0847      PT SHORT TERM GOAL #1   Title Patient will be independent with HEP in order to improve functional outcomes.    Time 3    Period Weeks    Status On-going    Target Date 11/18/20      PT SHORT TERM GOAL #2   Title Patient will report at least 25% improvement in symptoms for improved quality of life.    Time 3    Period Weeks    Status On-going    Target Date 11/18/20             PT Long Term Goals - 11/03/20 0847      PT LONG TERM GOAL #1   Title Patient will report at least 75% improvement in symptoms for improved quality of life.    Time 6    Period Weeks    Status On-going      PT LONG TERM GOAL #2   Title Patient will improve FOTO score by at least 15 points in order to indicate improved tolerance to activity.    Time 6    Period Weeks    Status  On-going      PT LONG TERM GOAL #3   Title Patient will be able to complete 5x STS in under 11.4 seconds in order to reduce the risk of falls.    Time 6    Period Weeks    Status On-going      PT LONG TERM GOAL #4   Title Patient will improve ROM for left knee extension/flexion to 5-115 degrees to improve squatting, and other functional mobility.    Time 6    Period Weeks    Status On-going      PT LONG TERM GOAL #5   Title Patient will be able to navigate stairs with reciprocal pattern without compensation in order to demonstrate improved LE strength.    Time 6    Period Weeks    Status On-going                 Plan - 11/17/20 0929    Clinical Impression Statement Good activity tolerance demonstrated with decreased need for rest periods and improve performance with sit to stand mobility demonstrating increased weight acceptance RLE from lower seat height surfaces. Continues to lack 5 degrees terminal right knee extension with swelling/edema still present peri-patellar and achieving 115 degrees flexion and ambulating wiht improved weight acceptance RLE    Personal Factors and Comorbidities Comorbidity 2;Past/Current Experience;Time since onset of injury/illness/exacerbation    Comorbidities DM, HTN    Examination-Activity Limitations Bend;Squat;Stairs;Stand;Transfers;Locomotion Level;Lift;Carry    Examination-Participation Restrictions Cleaning;Church;Community Activity;Shop;Volunteer;Driving;Yard Work;Occupation;Meal Prep    Stability/Clinical Decision Making Stable/Uncomplicated    Rehab Potential Good    PT Frequency 2x / week    PT Duration 6 weeks    PT Treatment/Interventions ADLs/Self Care Home Management;Aquatic Therapy;Cryotherapy;Electrical Stimulation;Iontophoresis 4mg /ml Dexamethasone;Moist Heat;Traction;DME Instruction;Gait training;Stair training;Ultrasound;Functional mobility training;Therapeutic activities;Therapeutic exercise;Balance training;Neuromuscular  re-education;Patient/family education;Manual techniques;Compression bandaging;Scar mobilization;Passive range of motion;Dry needling;Energy conservation;Splinting;Taping;Spinal Manipulations;Joint Manipulations;Vasopneumatic Device;Orthotic Fit/Training;Manual lymph drainage    PT Next Visit Plan Add supine TKE and prone knee hang if able to tolerate prone position.  Progress note on 3/30 prior MD apt.  Possibly PROM and continue with manual as needed.    PT Home Exercise Plan 3/10 quad set, heel prop on blue foam, heelslide, SLR, LAQm hamstring stretch    Consulted and Agree with Plan of Care Patient           Patient will benefit from skilled therapeutic intervention in order to improve the following deficits and impairments:  Abnormal gait,Increased edema,Decreased activity tolerance,Decreased strength,Pain,Decreased balance,Decreased mobility,Difficulty walking,Decreased range of motion,Improper body mechanics,Impaired flexibility  Visit Diagnosis: Other abnormalities of gait and mobility  Right knee pain, unspecified chronicity  Other symptoms and signs involving the musculoskeletal system  Stiffness of right knee, not elsewhere classified  Muscle weakness (generalized)     Problem List Patient Active Problem List   Diagnosis Date Noted  . S/P total knee arthroplasty, right 10/12/2020  . Primary osteoarthritis of right knee 06/11/2014   9:44 AM, 11/17/20 M. Shary Decamp, PT, DPT Physical Therapist- [place] Office Number: [phone]  [facility] [address] [place], [place], [address] Phone: [phone]   Fax:  [phone]  Name: Chris Wade MRN: [id number] Date of Birth: [birth year]

## 2020-11-17 NOTE — Patient Instructions (Signed)
Take celebrex twice a dAY   TAKE TRAMADOL FIRST IF STILL HAVING PAIN THEN TAKE HYDROCODONE

## 2020-11-19 ENCOUNTER — Other Ambulatory Visit: Payer: Self-pay

## 2020-11-19 ENCOUNTER — Encounter (HOSPITAL_COMMUNITY): Payer: Self-pay

## 2020-11-19 ENCOUNTER — Ambulatory Visit (HOSPITAL_COMMUNITY): Payer: Medicare Other | Attending: Orthopedic Surgery

## 2020-11-19 DIAGNOSIS — M25561 Pain in right knee: Secondary | ICD-10-CM | POA: Diagnosis present

## 2020-11-19 DIAGNOSIS — R29898 Other symptoms and signs involving the musculoskeletal system: Secondary | ICD-10-CM | POA: Diagnosis present

## 2020-11-19 DIAGNOSIS — R2689 Other abnormalities of gait and mobility: Secondary | ICD-10-CM

## 2020-11-19 DIAGNOSIS — M6281 Muscle weakness (generalized): Secondary | ICD-10-CM | POA: Diagnosis present

## 2020-11-19 DIAGNOSIS — M25661 Stiffness of right knee, not elsewhere classified: Secondary | ICD-10-CM | POA: Diagnosis present

## 2020-11-19 NOTE — Therapy (Signed)
[facility] [address] [place], [place], [address] Phone: [phone]   Fax:  [phone]  Physical Therapy Treatment  Patient Details  Name: Chris Wade MRN: [id number] Date of Birth: [birth year] Referring Provider (PT): Fuller Canada MD   Encounter Date: 11/19/2020   PT End of Session - 11/19/20 0953    Visit Number 7    Number of Visits 12    Date for PT Re-Evaluation 12/09/20    Authorization Type Primary: [employer] (vl 60, 0 used) Secondary Medicare    Authorization - Visit Number 7    Authorization - Number of Visits 60    Progress Note Due on Visit 10    PT Start Time [phone]    PT Stop Time 1030    PT Time Calculation (min) 44 min    Activity Tolerance Patient tolerated treatment well    Behavior During Therapy [facility] for tasks assessed/performed           Past Medical History:  Diagnosis Date  . Diabetes mellitus without complication (HCC)   . Hypercholesteremia   . Hypertension   . Shortness of breath     Past Surgical History:  Procedure Laterality Date  . BACK SURGERY     ruptured disc  . PROSTATE BIOPSY N/A 01/20/2014   Procedure: PROSTATE BIOPSY;  Surgeon: Ky Barban, MD;  Location: [facility];  Service: Urology;  Laterality: N/A;  . THYROIDECTOMY    . TOTAL KNEE ARTHROPLASTY Right 10/12/2020   Procedure: TOTAL KNEE ARTHROPLASTY;  Surgeon: Vickki Hearing, MD;  Location: [facility];  Service: Orthopedics;  Laterality: Right;    There were no vitals filed for this visit.   Subjective Assessment - 11/19/20 0952    Subjective Reports feeling better and less stiff. Easier time walking    Limitations House hold activities;Walking;Standing;Lifting    How long can you walk comfortably? 10-15 minutes    Patient Stated Goals Get back to normal    Currently in Pain? Yes    Pain Score 3     Pain Location Knee    Pain Orientation Right    Pain Descriptors / Indicators Aching    Pain Onset 1 to 4 weeks  ago              [facility] PT Assessment - 11/19/20 0001      Assessment   Medical Diagnosis s/p R TKA    Referring Provider (PT) Fuller Canada MD    Onset Date/Surgical Date 10/12/20                         [facility] Adult PT Treatment/Exercise - 11/19/20 0001      Knee/Hip Exercises: Stretches   Passive Hamstring Stretch Right;3 reps;30 seconds      Knee/Hip Exercises: Aerobic   Nustep level 4 x 7 min for dynamic warm-up      Knee/Hip Exercises: Machines for Strengthening   Cybex Knee Flexion 6 plates, 4Q68      Knee/Hip Exercises: Standing   Heel Raises Both;3 sets;10 reps    Terminal Knee Extension Strengthening;Right;3 sets;10 reps   4 plates   Other Standing Knee Exercises knee flexion drive stretch 3M19 sec 5" hold    Other Standing Knee Exercises --      Knee/Hip Exercises: Seated   Sit to Sand 2 sets;10 reps;without UE support   22" seat height     Knee/Hip Exercises: Supine   Heel  Slides AAROM;Right;3 sets;10 reps    Heel Slides Limitations with sheet to assist, 5 sec hold    Knee Extension AROM;Right    Knee Extension Limitations 4   lacking   Knee Flexion AROM    Knee Flexion Limitations 110                    PT Short Term Goals - 11/03/20 0847      PT SHORT TERM GOAL #1   Title Patient will be independent with HEP in order to improve functional outcomes.    Time 3    Period Weeks    Status On-going    Target Date 11/18/20      PT SHORT TERM GOAL #2   Title Patient will report at least 25% improvement in symptoms for improved quality of life.    Time 3    Period Weeks    Status On-going    Target Date 11/18/20             PT Long Term Goals - 11/03/20 0847      PT LONG TERM GOAL #1   Title Patient will report at least 75% improvement in symptoms for improved quality of life.    Time 6    Period Weeks    Status On-going      PT LONG TERM GOAL #2   Title Patient will improve FOTO score by at least 15 points in  order to indicate improved tolerance to activity.    Time 6    Period Weeks    Status On-going      PT LONG TERM GOAL #3   Title Patient will be able to complete 5x STS in under 11.4 seconds in order to reduce the risk of falls.    Time 6    Period Weeks    Status On-going      PT LONG TERM GOAL #4   Title Patient will improve ROM for left knee extension/flexion to 5-115 degrees to improve squatting, and other functional mobility.    Time 6    Period Weeks    Status On-going      PT LONG TERM GOAL #5   Title Patient will be able to navigate stairs with reciprocal pattern without compensation in order to demonstrate improved LE strength.    Time 6    Period Weeks    Status On-going                 Plan - 11/19/20 1007    Clinical Impression Statement Demonstrating improved activity tolerance and increased ROM with more of a normalized gait pattern appreciated throughout session. Pt reports he has now been able to negotiate stairs at his home with HR and cane.    Personal Factors and Comorbidities Comorbidity 2;Past/Current Experience;Time since onset of injury/illness/exacerbation    Comorbidities DM, HTN    Examination-Activity Limitations Bend;Squat;Stairs;Stand;Transfers;Locomotion Level;Lift;Carry    Examination-Participation Restrictions Cleaning;Church;Community Activity;Shop;Volunteer;Driving;Yard Work;Occupation;Meal Prep    Stability/Clinical Decision Making Stable/Uncomplicated    Rehab Potential Good    PT Frequency 2x / week    PT Duration 6 weeks    PT Treatment/Interventions ADLs/Self Care Home Management;Aquatic Therapy;Cryotherapy;Electrical Stimulation;Iontophoresis 4mg /ml Dexamethasone;Moist Heat;Traction;DME Instruction;Gait training;Stair training;Ultrasound;Functional mobility training;Therapeutic activities;Therapeutic exercise;Balance training;Neuromuscular re-education;Patient/family education;Manual techniques;Compression bandaging;Scar  mobilization;Passive range of motion;Dry needling;Energy conservation;Splinting;Taping;Spinal Manipulations;Joint Manipulations;Vasopneumatic Device;Orthotic Fit/Training;Manual lymph drainage    PT Next Visit Plan Add supine TKE and prone knee hang if able to tolerate prone position.  Progress note  on 3/30 prior MD apt.  Possibly PROM and continue with manual as needed.    PT Home Exercise Plan 3/10 quad set, heel prop on blue foam, heelslide, SLR, LAQm hamstring stretch. KNee drive flexion stretch    Consulted and Agree with Plan of Care Patient           Patient will benefit from skilled therapeutic intervention in order to improve the following deficits and impairments:  Abnormal gait,Increased edema,Decreased activity tolerance,Decreased strength,Pain,Decreased balance,Decreased mobility,Difficulty walking,Decreased range of motion,Improper body mechanics,Impaired flexibility  Visit Diagnosis: Other abnormalities of gait and mobility  Right knee pain, unspecified chronicity  Other symptoms and signs involving the musculoskeletal system  Stiffness of right knee, not elsewhere classified  Muscle weakness (generalized)     Problem List Patient Active Problem List   Diagnosis Date Noted  . S/P total knee arthroplasty, right 10/12/2020  . Primary osteoarthritis of right knee 06/11/2014   10:21 AM, 11/19/20 M. Shary Decamp, PT, DPT Physical Therapist- [place] Office Number: [phone]  [facility] [address] [place], [place], [address] Phone: [phone]   Fax:  [phone]  Name: Chris Wade MRN: [id number] Date of Birth: 19-Feb-1951

## 2020-11-22 ENCOUNTER — Other Ambulatory Visit: Payer: Self-pay

## 2020-11-22 ENCOUNTER — Encounter (HOSPITAL_COMMUNITY): Payer: Self-pay

## 2020-11-22 ENCOUNTER — Ambulatory Visit (HOSPITAL_COMMUNITY): Payer: Medicare Other

## 2020-11-22 DIAGNOSIS — M6281 Muscle weakness (generalized): Secondary | ICD-10-CM

## 2020-11-22 DIAGNOSIS — R29898 Other symptoms and signs involving the musculoskeletal system: Secondary | ICD-10-CM

## 2020-11-22 DIAGNOSIS — M25561 Pain in right knee: Secondary | ICD-10-CM

## 2020-11-22 DIAGNOSIS — M25661 Stiffness of right knee, not elsewhere classified: Secondary | ICD-10-CM

## 2020-11-22 DIAGNOSIS — R2689 Other abnormalities of gait and mobility: Secondary | ICD-10-CM

## 2020-11-22 NOTE — Therapy (Signed)
[facility] [address] [place], [place], [address] Phone: [phone]   Fax:  [phone]  Physical Therapy Treatment  Patient Details  Name: Chris Wade MRN: [id number] Date of Birth: 1950-12-29 Referring Provider (PT): Fuller Canada MD   Encounter Date: 11/22/2020   PT End of Session - 11/22/20 0908    Visit Number 8    Number of Visits 12    Date for PT Re-Evaluation 12/09/20    Authorization Type Primary: [employer] (vl 60, 0 used) Secondary Medicare    Authorization - Visit Number 8    Authorization - Number of Visits 60    Progress Note Due on Visit 10    PT Start Time 0906    PT Stop Time 0945    PT Time Calculation (min) 39 min    Activity Tolerance Patient tolerated treatment well    Behavior During Therapy [facility] for tasks assessed/performed           Past Medical History:  Diagnosis Date  . Diabetes mellitus without complication (HCC)   . Hypercholesteremia   . Hypertension   . Shortness of breath     Past Surgical History:  Procedure Laterality Date  . BACK SURGERY     ruptured disc  . PROSTATE BIOPSY N/A 01/20/2014   Procedure: PROSTATE BIOPSY;  Surgeon: Ky Barban, MD;  Location: [facility];  Service: Urology;  Laterality: N/A;  . THYROIDECTOMY    . TOTAL KNEE ARTHROPLASTY Right 10/12/2020   Procedure: TOTAL KNEE ARTHROPLASTY;  Surgeon: Vickki Hearing, MD;  Location: [facility];  Service: Orthopedics;  Laterality: Right;    There were no vitals filed for this visit.   Subjective Assessment - 11/22/20 0925    Subjective Reports easier time walking    Limitations House hold activities;Walking;Standing;Lifting    How long can you walk comfortably? 10-15 minutes    Patient Stated Goals Get back to normal    Currently in Pain? No/denies    Pain Score 0-No pain    Pain Location Knee    Pain Orientation Right    Pain Descriptors / Indicators Aching    Pain Onset 1 to 4 weeks ago               [facility] PT Assessment - 11/22/20 0001      Assessment   Medical Diagnosis s/p R TKA    Referring Provider (PT) Fuller Canada MD    Onset Date/Surgical Date 10/12/20                         [facility] Adult PT Treatment/Exercise - 11/22/20 0001      Knee/Hip Exercises: Stretches   Passive Hamstring Stretch Right;2 reps;60 seconds    Quad Stretch Right;2 reps;60 seconds    Lobbyist Limitations with belt/strap; prone    Theme park manager Right;2 reps;60 seconds    Gastroc Stretch Limitations long sitting with sheet for pulling      Knee/Hip Exercises: Aerobic   Nustep level 5 x 5 min dynamic warmup      Knee/Hip Exercises: Machines for Strengthening   Cybex Knee Flexion 8 plates, 1V61      Knee/Hip Exercises: Standing   Knee Flexion Strengthening;Right;3 sets;10 reps    Knee Flexion Limitations 10 lbs HS curls    Other Standing Knee Exercises knee flexion drive stretch 6W73 sec 5" hold      Knee/Hip Exercises: Seated   Long Arc  Quad Strengthening;Right;3 sets;10 reps    [facility] 10 lbs.    Sit to Sand 2 sets;10 reps;without UE support   22" seat height with 10 lbs dumbell     Knee/Hip Exercises: Supine   Heel Slides AAROM;Right;3 sets;10 reps    Heel Slides Limitations with sheet to assist, 5 sec hold    Knee Extension AROM;Right    Knee Extension Limitations 2-3   lacking   Knee Flexion AROM    Knee Flexion Limitations 118                    PT Short Term Goals - 11/03/20 0847      PT SHORT TERM GOAL #1   Title Patient will be independent with HEP in order to improve functional outcomes.    Time 3    Period Weeks    Status On-going    Target Date 11/18/20      PT SHORT TERM GOAL #2   Title Patient will report at least 25% improvement in symptoms for improved quality of life.    Time 3    Period Weeks    Status On-going    Target Date 11/18/20             PT Long Term Goals - 11/03/20 0847      PT LONG TERM  GOAL #1   Title Patient will report at least 75% improvement in symptoms for improved quality of life.    Time 6    Period Weeks    Status On-going      PT LONG TERM GOAL #2   Title Patient will improve FOTO score by at least 15 points in order to indicate improved tolerance to activity.    Time 6    Period Weeks    Status On-going      PT LONG TERM GOAL #3   Title Patient will be able to complete 5x STS in under 11.4 seconds in order to reduce the risk of falls.    Time 6    Period Weeks    Status On-going      PT LONG TERM GOAL #4   Title Patient will improve ROM for left knee extension/flexion to 5-115 degrees to improve squatting, and other functional mobility.    Time 6    Period Weeks    Status On-going      PT LONG TERM GOAL #5   Title Patient will be able to navigate stairs with reciprocal pattern without compensation in order to demonstrate improved LE strength.    Time 6    Period Weeks    Status On-going                 Plan - 11/22/20 0925    Clinical Impression Statement Tolerating increased resistance and progressive ROM exercises well without adverse effects continues to require cues/instruction in weight shifting during sit to stand for more symmetrical WBing. Overall demo improved ROM status for right knee flexion and extension and progressing with functional ambulation demonstrating a more normalized pattern with swing-through present and beginning to minimize lateral weight shift in stance    Personal Factors and Comorbidities Comorbidity 2;Past/Current Experience;Time since onset of injury/illness/exacerbation    Comorbidities DM, HTN    Examination-Activity Limitations Bend;Squat;Stairs;Stand;Transfers;Locomotion Level;Lift;Carry    Examination-Participation Restrictions Cleaning;Church;Community Activity;Shop;Volunteer;Driving;Yard Work;Occupation;Meal Prep    Stability/Clinical Decision Making Stable/Uncomplicated    Rehab Potential Good    PT  Frequency 2x / week  PT Duration 6 weeks    PT Treatment/Interventions ADLs/Self Care Home Management;Aquatic Therapy;Cryotherapy;Electrical Stimulation;Iontophoresis 4mg /ml Dexamethasone;Moist Heat;Traction;DME Instruction;Gait training;Stair training;Ultrasound;Functional mobility training;Therapeutic activities;Therapeutic exercise;Balance training;Neuromuscular re-education;Patient/family education;Manual techniques;Compression bandaging;Scar mobilization;Passive range of motion;Dry needling;Energy conservation;Splinting;Taping;Spinal Manipulations;Joint Manipulations;Vasopneumatic Device;Orthotic Fit/Training;Manual lymph drainage    PT Next Visit Plan Add supine TKE and prone knee hang if able to tolerate prone position.  Progress note on 3/30 prior MD apt.  Possibly PROM and continue with manual as needed.    PT Home Exercise Plan 3/10 quad set, heel prop on blue foam, heelslide, SLR, LAQm hamstring stretch. KNee drive flexion stretch    Consulted and Agree with Plan of Care Patient           Patient will benefit from skilled therapeutic intervention in order to improve the following deficits and impairments:  Abnormal gait,Increased edema,Decreased activity tolerance,Decreased strength,Pain,Decreased balance,Decreased mobility,Difficulty walking,Decreased range of motion,Improper body mechanics,Impaired flexibility  Visit Diagnosis: Other abnormalities of gait and mobility  Right knee pain, unspecified chronicity  Other symptoms and signs involving the musculoskeletal system  Stiffness of right knee, not elsewhere classified  Muscle weakness (generalized)     Problem List Patient Active Problem List   Diagnosis Date Noted  . S/P total knee arthroplasty, right 10/12/2020  . Primary osteoarthritis of right knee 06/11/2014   9:43 AM, 11/22/20 M. 01/22/21, PT, DPT Physical Therapist- [place] Office Number: [phone]  [facility] [address] [place], [place], [place] Phone: [phone]   Fax:  [phone]  Name: Chris Wade MRN: Standley Brooking Date of Birth: 12/04/1950

## 2020-11-23 ENCOUNTER — Encounter (HOSPITAL_COMMUNITY): Payer: Self-pay | Admitting: Physical Therapy

## 2020-11-23 ENCOUNTER — Ambulatory Visit (HOSPITAL_COMMUNITY): Payer: Medicare Other | Admitting: Physical Therapy

## 2020-11-23 DIAGNOSIS — R2689 Other abnormalities of gait and mobility: Secondary | ICD-10-CM

## 2020-11-23 DIAGNOSIS — M6281 Muscle weakness (generalized): Secondary | ICD-10-CM

## 2020-11-23 DIAGNOSIS — M25561 Pain in right knee: Secondary | ICD-10-CM

## 2020-11-23 DIAGNOSIS — M25661 Stiffness of right knee, not elsewhere classified: Secondary | ICD-10-CM

## 2020-11-23 DIAGNOSIS — R29898 Other symptoms and signs involving the musculoskeletal system: Secondary | ICD-10-CM

## 2020-11-23 NOTE — Therapy (Signed)
[facility] [address] [place], [place], [address] Phone: [phone]   Fax:  [phone]  Physical Therapy Treatment  Patient Details  Name: Chris Wade MRN: [id number] Date of Birth: 1950-10-06 Referring Provider (PT): Fuller Canada MD   Encounter Date: 11/23/2020   PT End of Session - 11/23/20 1315    Visit Number 9    Number of Visits 12    Date for PT Re-Evaluation 12/09/20    Authorization Type Primary: [employer] (vl 60, 0 used) Secondary Medicare    Authorization - Visit Number 9    Authorization - Number of Visits 60    Progress Note Due on Visit 10    PT Start Time 1315    PT Stop Time 1355    PT Time Calculation (min) 40 min    Activity Tolerance Patient tolerated treatment well    Behavior During Therapy [facility] for tasks assessed/performed           Past Medical History:  Diagnosis Date  . Diabetes mellitus without complication (HCC)   . Hypercholesteremia   . Hypertension   . Shortness of breath     Past Surgical History:  Procedure Laterality Date  . BACK SURGERY     ruptured disc  . PROSTATE BIOPSY N/A 01/20/2014   Procedure: PROSTATE BIOPSY;  Surgeon: Ky Barban, MD;  Location: [facility];  Service: Urology;  Laterality: N/A;  . THYROIDECTOMY    . TOTAL KNEE ARTHROPLASTY Right 10/12/2020   Procedure: TOTAL KNEE ARTHROPLASTY;  Surgeon: Vickki Hearing, MD;  Location: [facility];  Service: Orthopedics;  Laterality: Right;    There were no vitals filed for this visit.   Subjective Assessment - 11/23/20 1316    Subjective Patient feels like he is getting around good and its coming along alright.    Limitations House hold activities;Walking;Standing;Lifting    How long can you walk comfortably? 10-15 minutes    Patient Stated Goals Get back to normal    Currently in Pain? No/denies    Pain Onset 1 to 4 weeks ago                             [facility] Adult PT  Treatment/Exercise - 11/23/20 0001      Knee/Hip Exercises: Aerobic   Nustep level 5 x 5 min dynamic warmup      Knee/Hip Exercises: Machines for Strengthening   Other Machine weighted retro walking 3 plates 2x5      Knee/Hip Exercises: Standing   Knee Flexion Strengthening;Right;3 sets;10 reps    Knee Flexion Limitations 10 lbs HS curls    Lateral Step Up Right;2 sets;10 reps;Hand Hold: 2;Step Height: 6"    Lateral Step Up Limitations eccentric control    Forward Step Up 2 sets;10 reps;Step Height: 6"    Forward Step Up Limitations with knee drive      Knee/Hip Exercises: Seated   Sit to Sand 2 sets;10 reps;without UE support   18" seat height with 10 lbs dumbell                 PT Education - 11/23/20 1316    Education Details HEP, exercise mechanics    Person(s) Educated Patient    Methods Explanation;Demonstration    Comprehension Verbalized understanding;Returned demonstration            PT Short Term Goals - 11/03/20 [id number]  PT SHORT TERM GOAL #1   Title Patient will be independent with HEP in order to improve functional outcomes.    Time 3    Period Weeks    Status On-going    Target Date 11/18/20      PT SHORT TERM GOAL #2   Title Patient will report at least 25% improvement in symptoms for improved quality of life.    Time 3    Period Weeks    Status On-going    Target Date 11/18/20             PT Long Term Goals - 11/03/20 0847      PT LONG TERM GOAL #1   Title Patient will report at least 75% improvement in symptoms for improved quality of life.    Time 6    Period Weeks    Status On-going      PT LONG TERM GOAL #2   Title Patient will improve FOTO score by at least 15 points in order to indicate improved tolerance to activity.    Time 6    Period Weeks    Status On-going      PT LONG TERM GOAL #3   Title Patient will be able to complete 5x STS in under 11.4 seconds in order to reduce the risk of falls.    Time 6    Period  Weeks    Status On-going      PT LONG TERM GOAL #4   Title Patient will improve ROM for left knee extension/flexion to 5-115 degrees to improve squatting, and other functional mobility.    Time 6    Period Weeks    Status On-going      PT LONG TERM GOAL #5   Title Patient will be able to navigate stairs with reciprocal pattern without compensation in order to demonstrate improved LE strength.    Time 6    Period Weeks    Status On-going                 Plan - 11/23/20 1315    Clinical Impression Statement Began session with nu step for dynamic warm up and improving endurance. Patient requires unilateral UE support for step up exercises secondary to impaired strength and balance. Patient demonstrates impaired eccentric strength and motor control with lateral step down exercise and is given cueing for slow, controlled movements. Patient able to complete goblet squat/STS from lower height chair today with good mechanics and equal weight bearing. Patient will continue to benefit from skilled physical therapy in order to reduce impairment and improve function.    Personal Factors and Comorbidities Comorbidity 2;Past/Current Experience;Time since onset of injury/illness/exacerbation    Comorbidities DM, HTN    Examination-Activity Limitations Bend;Squat;Stairs;Stand;Transfers;Locomotion Level;Lift;Carry    Examination-Participation Restrictions Cleaning;Church;Community Activity;Shop;Volunteer;Driving;Yard Work;Occupation;Meal Prep    Stability/Clinical Decision Making Stable/Uncomplicated    Rehab Potential Good    PT Frequency 2x / week    PT Duration 6 weeks    PT Treatment/Interventions ADLs/Self Care Home Management;Aquatic Therapy;Cryotherapy;Electrical Stimulation;Iontophoresis 4mg /ml Dexamethasone;Moist Heat;Traction;DME Instruction;Gait training;Stair training;Ultrasound;Functional mobility training;Therapeutic activities;Therapeutic exercise;Balance training;Neuromuscular  re-education;Patient/family education;Manual techniques;Compression bandaging;Scar mobilization;Passive range of motion;Dry needling;Energy conservation;Splinting;Taping;Spinal Manipulations;Joint Manipulations;Vasopneumatic Device;Orthotic Fit/Training;Manual lymph drainage    PT Next Visit Plan Progress strengthening as able.  Possibly PROM and continue with manual as needed. PN next session    PT Home Exercise Plan 3/10 quad set, heel prop on blue foam, heelslide, SLR, LAQm hamstring stretch. KNee drive flexion stretch  Consulted and Agree with Plan of Care Patient           Patient will benefit from skilled therapeutic intervention in order to improve the following deficits and impairments:  Abnormal gait,Increased edema,Decreased activity tolerance,Decreased strength,Pain,Decreased balance,Decreased mobility,Difficulty walking,Decreased range of motion,Improper body mechanics,Impaired flexibility  Visit Diagnosis: Other abnormalities of gait and mobility  Right knee pain, unspecified chronicity  Other symptoms and signs involving the musculoskeletal system  Stiffness of right knee, not elsewhere classified  Muscle weakness (generalized)     Problem List Patient Active Problem List   Diagnosis Date Noted  . S/P total knee arthroplasty, right 10/12/2020  . Primary osteoarthritis of right knee 06/11/2014    1:56 PM, 11/23/20 Wyman Songster PT, DPT Physical Therapist at [facility]   [facility] [address] [place], [place], [address] Phone: [phone]   Fax:  [phone]  Name: DAMIN SALIDO MRN: [id number] Date of Birth: [birth year]

## 2020-11-25 ENCOUNTER — Ambulatory Visit (HOSPITAL_COMMUNITY): Payer: Medicare Other | Admitting: Physical Therapy

## 2020-11-30 ENCOUNTER — Ambulatory Visit (HOSPITAL_COMMUNITY): Payer: Medicare Other

## 2020-11-30 ENCOUNTER — Other Ambulatory Visit: Payer: Self-pay

## 2020-11-30 ENCOUNTER — Encounter (HOSPITAL_COMMUNITY): Payer: Self-pay

## 2020-11-30 DIAGNOSIS — R2689 Other abnormalities of gait and mobility: Secondary | ICD-10-CM | POA: Diagnosis not present

## 2020-11-30 DIAGNOSIS — M6281 Muscle weakness (generalized): Secondary | ICD-10-CM

## 2020-11-30 DIAGNOSIS — R29898 Other symptoms and signs involving the musculoskeletal system: Secondary | ICD-10-CM

## 2020-11-30 DIAGNOSIS — M25561 Pain in right knee: Secondary | ICD-10-CM

## 2020-11-30 DIAGNOSIS — M25661 Stiffness of right knee, not elsewhere classified: Secondary | ICD-10-CM

## 2020-11-30 NOTE — Therapy (Signed)
[facility] [address] [place], [place], [address] Phone: [phone]   Fax:  [phone]  Physical Therapy Treatment  Patient Details  Name: Chris Wade MRN: [id number] Date of Birth: 16-Mar-1951 Referring Provider (PT): Fuller Canada MD   Encounter Date: 11/30/2020   PT End of Session - 11/30/20 1035    Visit Number 10    Number of Visits 12    Date for PT Re-Evaluation 12/09/20    Authorization Type Primary: [employer] (vl 60, 0 used) Secondary Medicare    Authorization - Visit Number 10    Authorization - Number of Visits 60    Progress Note Due on Visit 10    PT Start Time 1030    PT Stop Time 1115    PT Time Calculation (min) 45 min    Activity Tolerance Patient tolerated treatment well    Behavior During Therapy [facility] for tasks assessed/performed           Past Medical History:  Diagnosis Date  . Diabetes mellitus without complication (HCC)   . Hypercholesteremia   . Hypertension   . Shortness of breath     Past Surgical History:  Procedure Laterality Date  . BACK SURGERY     ruptured disc  . PROSTATE BIOPSY N/A 01/20/2014   Procedure: PROSTATE BIOPSY;  Surgeon: Ky Barban, MD;  Location: [facility];  Service: Urology;  Laterality: N/A;  . THYROIDECTOMY    . TOTAL KNEE ARTHROPLASTY Right 10/12/2020   Procedure: TOTAL KNEE ARTHROPLASTY;  Surgeon: Vickki Hearing, MD;  Location: [facility];  Service: Orthopedics;  Laterality: Right;    There were no vitals filed for this visit.   Subjective Assessment - 11/30/20 1036    Subjective Pt reports feeling better and better with occasional sharp pains in front of right leg    Limitations House hold activities;Walking;Standing;Lifting    How long can you walk comfortably? 10-15 minutes    Patient Stated Goals Get back to normal    Currently in Pain? No/denies    Pain Score 0-No pain    Pain Location Knee    Pain Orientation Right    Pain Descriptors /  Indicators Aching    Pain Type Surgical pain    Pain Onset 1 to 4 weeks ago                             [facility] Adult PT Treatment/Exercise - 11/30/20 0001      Knee/Hip Exercises: Stretches   Passive Hamstring Stretch Right;2 reps;60 seconds    Passive Hamstring Stretch Limitations standing, split-leg with slant board    Gastroc Stretch Both;2 reps;60 seconds    Gastroc Stretch Limitations slantboard      Knee/Hip Exercises: Aerobic   Nustep level 8 x 5 min LE only      Knee/Hip Exercises: Machines for Strengthening   Cybex Knee Flexion 7 plates, 3W46      Knee/Hip Exercises: Standing   Knee Flexion Strengthening;Both;3 sets;10 reps    Knee Flexion Limitations 5 lbs    Hip Flexion Stengthening;Both   2 min stair taps x 2 min   Forward Step Up 2 sets;10 reps;Step Height: 6"    Rocker Board 4 minutes   2 min ant-post, 2 min lateral   Other Standing Knee Exercises sidestepping x 2 min 5 lbs ankle weights  PT Education - 11/30/20 1048    Education Details pt education in exercise progression and increasing WBing strength exercises    Person(s) Educated Patient    Methods Explanation;Demonstration    Comprehension Verbalized understanding            PT Short Term Goals - 11/03/20 0847      PT SHORT TERM GOAL #1   Title Patient will be independent with HEP in order to improve functional outcomes.    Time 3    Period Weeks    Status On-going    Target Date 11/18/20      PT SHORT TERM GOAL #2   Title Patient will report at least 25% improvement in symptoms for improved quality of life.    Time 3    Period Weeks    Status On-going    Target Date 11/18/20             PT Long Term Goals - 11/03/20 0847      PT LONG TERM GOAL #1   Title Patient will report at least 75% improvement in symptoms for improved quality of life.    Time 6    Period Weeks    Status On-going      PT LONG TERM GOAL #2   Title Patient will  improve FOTO score by at least 15 points in order to indicate improved tolerance to activity.    Time 6    Period Weeks    Status On-going      PT LONG TERM GOAL #3   Title Patient will be able to complete 5x STS in under 11.4 seconds in order to reduce the risk of falls.    Time 6    Period Weeks    Status On-going      PT LONG TERM GOAL #4   Title Patient will improve ROM for left knee extension/flexion to 5-115 degrees to improve squatting, and other functional mobility.    Time 6    Period Weeks    Status On-going      PT LONG TERM GOAL #5   Title Patient will be able to navigate stairs with reciprocal pattern without compensation in order to demonstrate improved LE strength.    Time 6    Period Weeks    Status On-going                 Plan - 11/30/20 1052    Clinical Impression Statement Progressing with both endurance and strength activities without adverse effects and demonstrating greater activity tolerance per his report as he has been able to initiate mowing the lawn without issue. Continued POC indicated to improve functional lifts and progress to ambulation without AD.    Personal Factors and Comorbidities Comorbidity 2;Past/Current Experience;Time since onset of injury/illness/exacerbation    Comorbidities DM, HTN    Examination-Activity Limitations Bend;Squat;Stairs;Stand;Transfers;Locomotion Level;Lift;Carry    Examination-Participation Restrictions Cleaning;Church;Community Activity;Shop;Volunteer;Driving;Yard Work;Occupation;Meal Prep    Stability/Clinical Decision Making Stable/Uncomplicated    Rehab Potential Good    PT Frequency 2x / week    PT Duration 6 weeks    PT Treatment/Interventions ADLs/Self Care Home Management;Aquatic Therapy;Cryotherapy;Electrical Stimulation;Iontophoresis 4mg /ml Dexamethasone;Moist Heat;Traction;DME Instruction;Gait training;Stair training;Ultrasound;Functional mobility training;Therapeutic activities;Therapeutic  exercise;Balance training;Neuromuscular re-education;Patient/family education;Manual techniques;Compression bandaging;Scar mobilization;Passive range of motion;Dry needling;Energy conservation;Splinting;Taping;Spinal Manipulations;Joint Manipulations;Vasopneumatic Device;Orthotic Fit/Training;Manual lymph drainage    PT Next Visit Plan Progress strengthening as able.  Possibly PROM and continue with manual as needed. PN next session    PT Home Exercise Plan  3/10 quad set, heel prop on blue foam, heelslide, SLR, LAQm hamstring stretch. KNee drive flexion stretch    Consulted and Agree with Plan of Care Patient           Patient will benefit from skilled therapeutic intervention in order to improve the following deficits and impairments:  Abnormal gait,Increased edema,Decreased activity tolerance,Decreased strength,Pain,Decreased balance,Decreased mobility,Difficulty walking,Decreased range of motion,Improper body mechanics,Impaired flexibility  Visit Diagnosis: Other abnormalities of gait and mobility  Right knee pain, unspecified chronicity  Other symptoms and signs involving the musculoskeletal system  Stiffness of right knee, not elsewhere classified  Muscle weakness (generalized)     Problem List Patient Active Problem List   Diagnosis Date Noted  . S/P total knee arthroplasty, right 10/12/2020  . Primary osteoarthritis of right knee 06/11/2014   11:10 AM, 11/30/20 M. Shary Decamp, PT, DPT Physical Therapist- [place] Office Number: [phone]  [facility] [address] [place], [place], [address] Phone: [phone]   Fax:  [phone]  Name: Chris Wade MRN: [id number] Date of Birth: [birth year]

## 2020-12-02 ENCOUNTER — Other Ambulatory Visit: Payer: Self-pay

## 2020-12-02 ENCOUNTER — Encounter (HOSPITAL_COMMUNITY): Payer: Self-pay | Admitting: Physical Therapy

## 2020-12-02 ENCOUNTER — Ambulatory Visit (HOSPITAL_COMMUNITY): Payer: Medicare Other | Admitting: Physical Therapy

## 2020-12-02 DIAGNOSIS — M25561 Pain in right knee: Secondary | ICD-10-CM

## 2020-12-02 DIAGNOSIS — R2689 Other abnormalities of gait and mobility: Secondary | ICD-10-CM

## 2020-12-02 DIAGNOSIS — M6281 Muscle weakness (generalized): Secondary | ICD-10-CM

## 2020-12-02 DIAGNOSIS — M25661 Stiffness of right knee, not elsewhere classified: Secondary | ICD-10-CM

## 2020-12-02 DIAGNOSIS — R29898 Other symptoms and signs involving the musculoskeletal system: Secondary | ICD-10-CM

## 2020-12-02 NOTE — Therapy (Signed)
[place], [place], [address] Phone: [phone]   Fax:  [phone]  Physical Therapy Treatment/Discharge Summary  Patient Details  Name: Chris Wade MRN: [id number] Date of Birth: 12/29/1950 Referring Provider (PT): Arther Abbott MD   Encounter Date: 12/02/2020  RIGHT KNEE ROM:  Lacking 1 to 113 degrees  PHYSICAL THERAPY DISCHARGE SUMMARY  Visits from Start of Care: 11  Current functional level related to goals / functional outcomes: See below   Remaining deficits: See below   Education / Equipment: See below  Plan: Patient agrees to discharge.  Patient goals were met. Patient is being discharged due to being pleased with the current functional level.  ?????          PT End of Session - 12/02/20 1320    Visit Number 11    Number of Visits 12    Date for PT Re-Evaluation 12/09/20    Authorization Type Primary: [employer] (vl 60, 0 used) Secondary Medicare    Authorization - Visit Number 11    Authorization - Number of Visits 60    Progress Note Due on Visit 10    PT Start Time 1320    PT Stop Time 1350    PT Time Calculation (min) 30 min    Activity Tolerance Patient tolerated treatment well    Behavior During Therapy WFL for tasks assessed/performed           Past Medical History:  Diagnosis Date  . Diabetes mellitus without complication ([place])   . Hypercholesteremia   . Hypertension   . Shortness of breath     Past Surgical History:  Procedure Laterality Date  . BACK SURGERY     ruptured disc  . PROSTATE BIOPSY N/A 01/20/2014   Procedure: PROSTATE BIOPSY;  Surgeon: Marissa Nestle, MD;  Location: [facility];  Service: Urology;  Laterality: N/A;  . THYROIDECTOMY    . TOTAL KNEE ARTHROPLASTY Right 10/12/2020   Procedure: TOTAL KNEE ARTHROPLASTY;  Surgeon: Carole Civil, MD;  Location: [facility];  Service: Orthopedics;  Laterality: Right;    There were no vitals filed  for this visit.   Subjective Assessment - 12/02/20 1321    Subjective Patient states he occasionally sharp pains in knee with rest. His home exercises are going well. He has trouble going up and down the steps. Patient states 85-90% improvement with PT intervention.    Limitations House hold activities;Walking;Standing;Lifting    How long can you walk comfortably? 10-15 minutes    Patient Stated Goals Get back to normal    Currently in Pain? No/denies    Pain Onset 1 to 4 weeks ago              [facility] PT Assessment - 12/02/20 0001      Assessment   Medical Diagnosis s/p R TKA    Referring Provider (PT) Arther Abbott MD    Onset Date/Surgical Date 10/12/20    Next MD Visit End april    Prior Therapy none      Precautions   Precautions None      Restrictions   Weight Bearing Restrictions No      Balance Screen   Has the patient fallen in the past 6 months No    Has the patient had a decrease in activity level because of a fear of falling?  No    Is the patient reluctant to leave their home because of a fear  of falling?  No      Prior Function   Level of Independence Independent    Vocation Retired    Human resources officer grass      Cognition   Overall Cognitive Status Within Functional Limits for tasks assessed      Observation/Other Assessments   Focus on Therapeutic Outcomes (FOTO)  84% function      AROM   Right Knee Extension 1   lacking   Right Knee Flexion 113      Strength   Right Hip Flexion 5/5    Left Hip Flexion 5/5    Right Knee Flexion 5/5    Right Knee Extension 5/5    Left Knee Flexion 5/5    Left Knee Extension 5/5    Right Ankle Dorsiflexion 5/5    Left Ankle Dorsiflexion 5/5      Transfers   Five time sit to stand comments  12 seconds without UE support      Ambulation/Gait   Stairs Yes    Stairs Assistance 5: Supervision    Stair Management Technique Alternating pattern    Number of Stairs 10    Height of Stairs 7    Gait  Comments minimally decreased eccentric control                         [facility] Adult PT Treatment/Exercise - 12/02/20 0001      Knee/Hip Exercises: Aerobic   Nustep level 8 x 5 min LE only      Knee/Hip Exercises: Standing   Forward Step Up 2 sets;10 reps;Step Height: 6"    Step Down Right;10 reps;Hand Hold: 1;Step Height: 6"                  PT Education - 12/02/20 1321    Education Details HEP, exercise mechanics    Person(s) Educated Patient    Methods Explanation;Demonstration    Comprehension Verbalized understanding;Returned demonstration            PT Short Term Goals - 12/02/20 1342      PT SHORT TERM GOAL #1   Title Patient will be independent with HEP in order to improve functional outcomes.    Time 3    Period Weeks    Status Achieved    Target Date 11/18/20      PT SHORT TERM GOAL #2   Title Patient will report at least 25% improvement in symptoms for improved quality of life.    Time 3    Period Weeks    Status Achieved    Target Date 11/18/20             PT Long Term Goals - 12/02/20 1343      PT LONG TERM GOAL #1   Title Patient will report at least 75% improvement in symptoms for improved quality of life.    Time 6    Period Weeks    Status Achieved      PT LONG TERM GOAL #2   Title Patient will improve FOTO score by at least 15 points in order to indicate improved tolerance to activity.    Time 6    Period Weeks    Status Achieved      PT LONG TERM GOAL #3   Title Patient will be able to complete 5x STS in under 11.4 seconds in order to reduce the risk of falls.    Time 6  Period Weeks    Status Partially Met      PT LONG TERM GOAL #4   Title Patient will improve ROM for left knee extension/flexion to 5-115 degrees to improve squatting, and other functional mobility.    Time 6    Period Weeks    Status Partially Met      PT LONG TERM GOAL #5   Title Patient will be able to navigate stairs with reciprocal  pattern without compensation in order to demonstrate improved LE strength.    Time 6    Period Weeks    Status Partially Met                 Plan - 12/02/20 1320    Clinical Impression Statement Patient has met 2/2 short term goals and 2/4 long term goals with remaining goals being partially met. Goals have been met with ability to complete HEP and improvement in symptoms, ROM, activity tolerance, gait, stair navigation. Patient remains limited by pain at rest and edema which is likely limiting end range motion. Patient showing good strength but lacking minimally eccentric strength with stairs. Patient discharged from physical therapy at this time.    Personal Factors and Comorbidities Comorbidity 2;Past/Current Experience;Time since onset of injury/illness/exacerbation    Comorbidities DM, HTN    Examination-Activity Limitations Bend;Squat;Stairs;Stand;Transfers;Locomotion Level;Lift;Carry    Examination-Participation Restrictions Cleaning;Church;Community Activity;Shop;Volunteer;Driving;Yard Work;Occupation;Meal Prep    Stability/Clinical Decision Making Stable/Uncomplicated    Rehab Potential Good    PT Frequency --    PT Duration --    PT Treatment/Interventions ADLs/Self Care Home Management;Aquatic Therapy;Cryotherapy;Electrical Stimulation;Iontophoresis 51m/ml Dexamethasone;Moist Heat;Traction;DME Instruction;Gait training;Stair training;Ultrasound;Functional mobility training;Therapeutic activities;Therapeutic exercise;Balance training;Neuromuscular re-education;Patient/family education;Manual techniques;Compression bandaging;Scar mobilization;Passive range of motion;Dry needling;Energy conservation;Splinting;Taping;Spinal Manipulations;Joint Manipulations;Vasopneumatic Device;Orthotic Fit/Training;Manual lymph drainage    PT Next Visit Plan n/a    PT Home Exercise Plan 3/10 quad set, heel prop on blue foam, heelslide, SLR, LAQm hamstring stretch. KNee drive flexion stretch 4/14  forward step down    Consulted and Agree with Plan of Care Patient           Patient will benefit from skilled therapeutic intervention in order to improve the following deficits and impairments:  Abnormal gait,Increased edema,Decreased activity tolerance,Decreased strength,Pain,Decreased balance,Decreased mobility,Difficulty walking,Decreased range of motion,Improper body mechanics,Impaired flexibility  Visit Diagnosis: Other abnormalities of gait and mobility  Right knee pain, unspecified chronicity  Other symptoms and signs involving the musculoskeletal system  Stiffness of right knee, not elsewhere classified  Muscle weakness (generalized)     Problem List Patient Active Problem List   Diagnosis Date Noted  . S/P total knee arthroplasty, right 10/12/2020  . Primary osteoarthritis of right knee 06/11/2014    1:58 PM, 12/02/20 AMearl LatinPT, DPT [place] [address]Phone: [phone]  Fax:  [phone] Name: LLATASHA PUSKASMRN: [id number] of Birth: 6October 31, 1952

## 2020-12-02 NOTE — Patient Instructions (Signed)
Access Code: [facility] URL: [email or web address] Date: 12/02/2020 Prepared by: Greig Castilla Draden Cottingham  Exercises Forward Step Down - 1 x daily - 7 x weekly - 2 sets - 10 reps

## 2020-12-07 ENCOUNTER — Ambulatory Visit (HOSPITAL_COMMUNITY): Payer: Medicare Other | Admitting: Physical Therapy

## 2020-12-09 ENCOUNTER — Encounter (HOSPITAL_COMMUNITY): Payer: 59 | Admitting: Physical Therapy

## 2020-12-15 ENCOUNTER — Ambulatory Visit (INDEPENDENT_AMBULATORY_CARE_PROVIDER_SITE_OTHER): Payer: 59 | Admitting: Orthopedic Surgery

## 2020-12-15 ENCOUNTER — Encounter: Payer: Self-pay | Admitting: Orthopedic Surgery

## 2020-12-15 ENCOUNTER — Other Ambulatory Visit: Payer: Self-pay

## 2020-12-15 DIAGNOSIS — E1165 Type 2 diabetes mellitus with hyperglycemia: Secondary | ICD-10-CM | POA: Insufficient documentation

## 2020-12-15 DIAGNOSIS — Z96651 Presence of right artificial knee joint: Secondary | ICD-10-CM

## 2020-12-15 DIAGNOSIS — M25561 Pain in right knee: Secondary | ICD-10-CM

## 2020-12-15 DIAGNOSIS — M1710 Unilateral primary osteoarthritis, unspecified knee: Secondary | ICD-10-CM

## 2020-12-15 MED ORDER — HYDROCODONE-ACETAMINOPHEN 5-325 MG PO TABS: 1.0000 | ORAL_TABLET | Freq: Two times a day (BID) | ORAL | 0 refills | Status: AC | PRN

## 2020-12-15 NOTE — Progress Notes (Signed)
Chief Complaint  Patient presents with  . Post-op Follow-up    10/12/20 right total knee has been mowing with increased pain    Mr. Hoback is coming up on 3 months postop from his total knee he is doing well he has occasional pain in his knee and he takes 1 hydrocodone in the morning 1 at night he still using ice.  He can fully extend the knee is 115 degrees of flexion and his knee is stable  He should continue icing we will continue to reduce his opioid load and I will see him in 3 months  .medsth Meds ordered this encounter  Medications  . HYDROcodone-acetaminophen (NORCO/VICODIN) 5-325 MG tablet    Sig: Take 1 tablet by mouth every 12 (twelve) hours as needed for up to 7 days for moderate pain.    Dispense:  14 tablet    Refill:  0

## 2021-01-05 ENCOUNTER — Other Ambulatory Visit: Payer: Self-pay | Admitting: Orthopedic Surgery

## 2021-01-05 DIAGNOSIS — M25561 Pain in right knee: Secondary | ICD-10-CM

## 2021-01-05 DIAGNOSIS — M1710 Unilateral primary osteoarthritis, unspecified knee: Secondary | ICD-10-CM

## 2021-01-28 ENCOUNTER — Other Ambulatory Visit: Payer: Self-pay | Admitting: Orthopedic Surgery

## 2021-01-28 DIAGNOSIS — M25561 Pain in right knee: Secondary | ICD-10-CM

## 2021-01-28 DIAGNOSIS — M1710 Unilateral primary osteoarthritis, unspecified knee: Secondary | ICD-10-CM

## 2021-02-24 ENCOUNTER — Other Ambulatory Visit: Payer: Self-pay

## 2021-02-24 ENCOUNTER — Encounter: Payer: Self-pay | Admitting: Orthopedic Surgery

## 2021-02-24 ENCOUNTER — Ambulatory Visit: Payer: 59

## 2021-02-24 ENCOUNTER — Ambulatory Visit (INDEPENDENT_AMBULATORY_CARE_PROVIDER_SITE_OTHER): Payer: 59 | Admitting: Orthopedic Surgery

## 2021-02-24 VITALS — BP 105/69 | HR 104 | Ht 69.0 in | Wt 209.0 lb

## 2021-02-24 DIAGNOSIS — Z96651 Presence of right artificial knee joint: Secondary | ICD-10-CM | POA: Diagnosis not present

## 2021-02-24 DIAGNOSIS — M25561 Pain in right knee: Secondary | ICD-10-CM

## 2021-02-24 DIAGNOSIS — M1711 Unilateral primary osteoarthritis, right knee: Secondary | ICD-10-CM

## 2021-02-24 DIAGNOSIS — M25461 Effusion, right knee: Secondary | ICD-10-CM

## 2021-02-24 DIAGNOSIS — E119 Type 2 diabetes mellitus without complications: Secondary | ICD-10-CM | POA: Insufficient documentation

## 2021-02-24 MED ORDER — IBUPROFEN 800 MG PO TABS: 800.0000 mg | ORAL_TABLET | Freq: Three times a day (TID) | ORAL | 1 refills | Status: DC | PRN

## 2021-02-24 NOTE — Progress Notes (Signed)
FOLLOW UP   Encounter Diagnoses  Name Primary?   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 S/P TKR DOS 10/12/20   70 year old male had a right total knee doing well get some swelling and aching at night seems to be aggravated by mowing the lawn  Exam shows a normal incision full extension 120 degrees of flexion no instability mild effusion  X-ray negative for acute process  Recommend ice after activity such as mowing the lawn and take 800 mg ibuprofen for swelling at night  Meds ordered this encounter  Medications   ibuprofen (ADVIL) 800 MG tablet    Sig: Take 1 tablet (800 mg total) by mouth every 8 (eight) hours as needed.    Dispense:  90 tablet    Refill:  1

## 2021-02-24 NOTE — Patient Instructions (Addendum)
Apply ice to the knee after mowing the lawn and take 800 mg ibuprofen

## 2021-02-26 ENCOUNTER — Other Ambulatory Visit: Payer: Self-pay | Admitting: Orthopedic Surgery

## 2021-02-26 DIAGNOSIS — M1710 Unilateral primary osteoarthritis, unspecified knee: Secondary | ICD-10-CM

## 2021-02-26 DIAGNOSIS — M25561 Pain in right knee: Secondary | ICD-10-CM

## 2021-03-16 ENCOUNTER — Encounter: Payer: Self-pay | Admitting: Orthopedic Surgery

## 2021-03-16 ENCOUNTER — Ambulatory Visit: Payer: 59 | Admitting: Orthopedic Surgery

## 2021-04-04 IMAGING — DX DG KNEE 1-2V PORT*R*
2 series · 2 of 2 positions shown · non-contrast
Comparison: None.

CLINICAL DATA: Knee replacement

EXAM:
PORTABLE RIGHT KNEE - 1-2 VIEW

[knee ap]
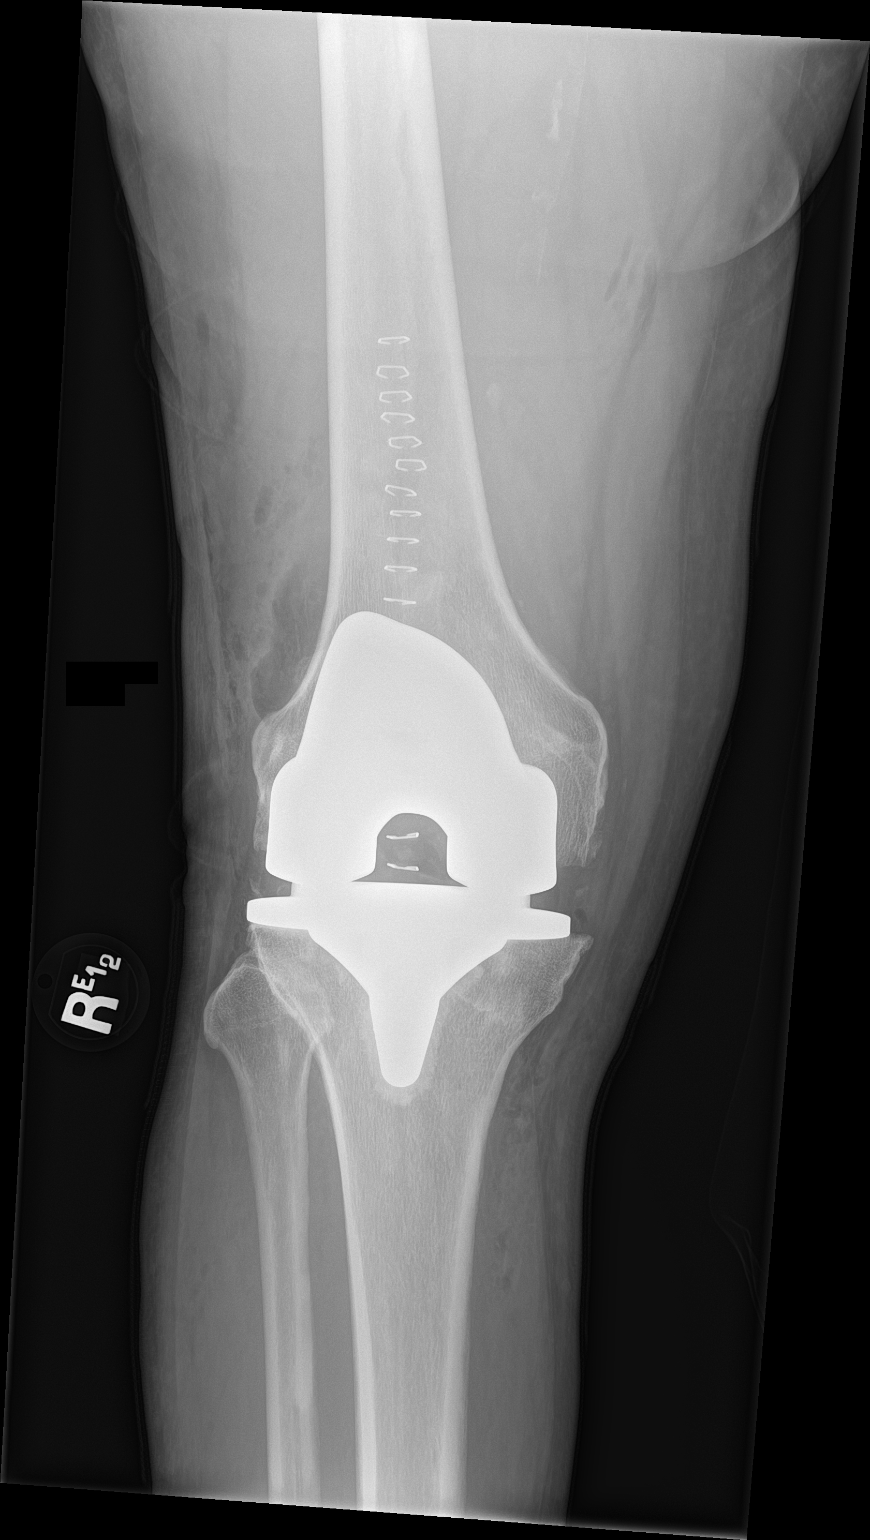

[knee lat]
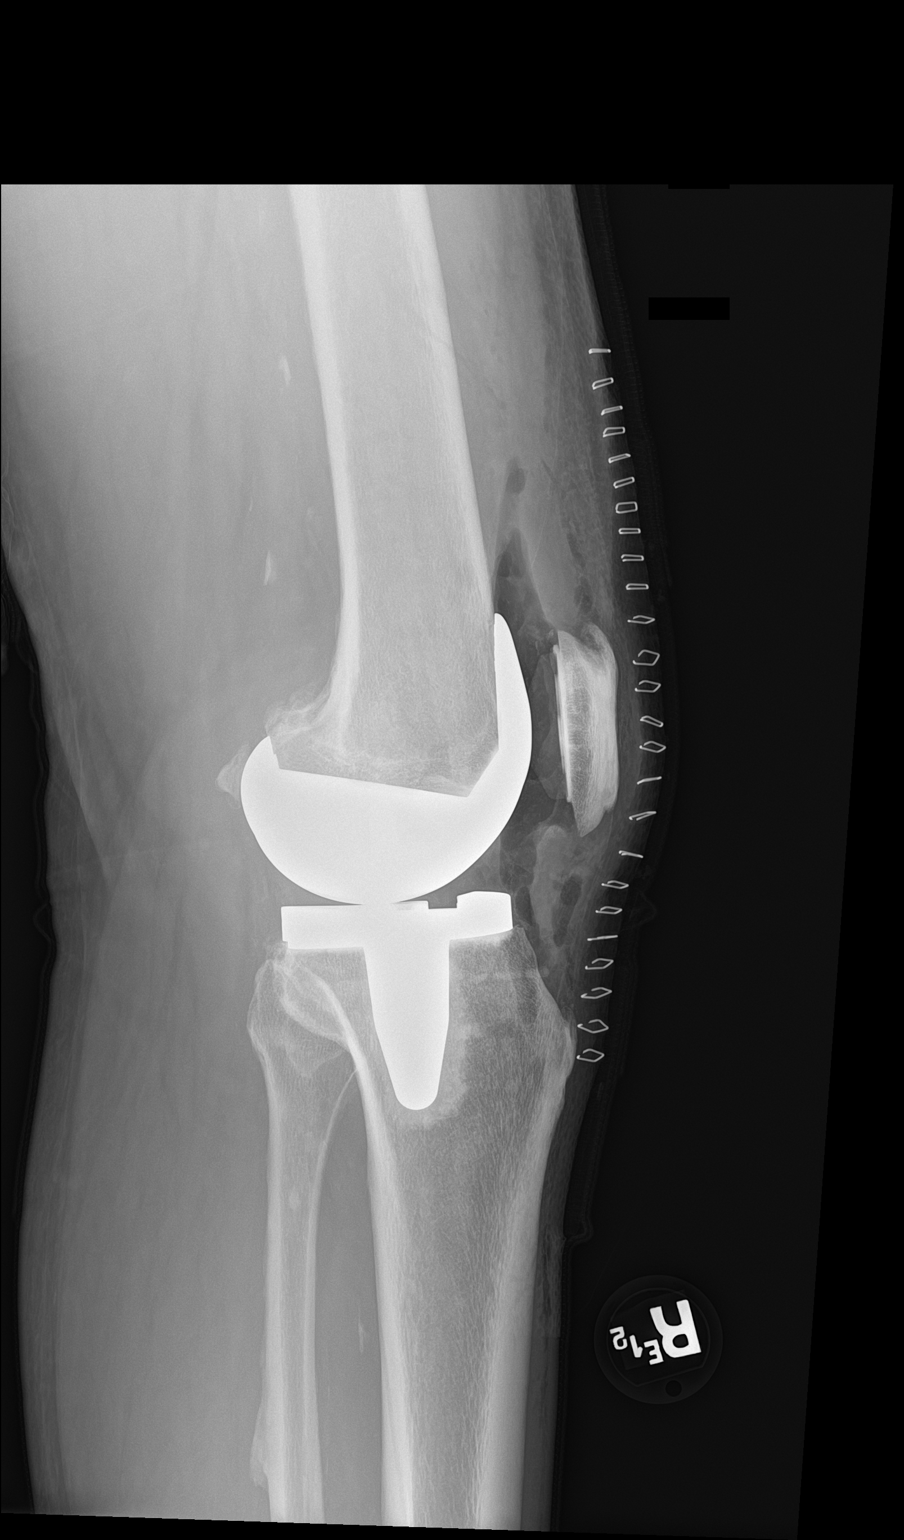

[2 of 2 positions shown; findings below may reference images not displayed]

FINDINGS: Right total knee arthroplasty. Postoperative soft tissue swelling
and air. No evidence complication.
IMPRESSION: Standard postoperative appearance of right total knee arthroplasty.

## 2021-04-27 ENCOUNTER — Other Ambulatory Visit: Payer: Self-pay | Admitting: Orthopedic Surgery

## 2021-04-27 DIAGNOSIS — M25461 Effusion, right knee: Secondary | ICD-10-CM

## 2021-04-27 DIAGNOSIS — Z96651 Presence of right artificial knee joint: Secondary | ICD-10-CM

## 2021-05-19 ENCOUNTER — Ambulatory Visit: Payer: 59 | Admitting: Orthopedic Surgery

## 2021-05-19 DIAGNOSIS — R011 Cardiac murmur, unspecified: Secondary | ICD-10-CM | POA: Insufficient documentation

## 2021-06-06 ENCOUNTER — Encounter: Payer: Self-pay | Admitting: Orthopedic Surgery

## 2021-06-06 ENCOUNTER — Ambulatory Visit (INDEPENDENT_AMBULATORY_CARE_PROVIDER_SITE_OTHER): Payer: Medicare Other | Admitting: Orthopedic Surgery

## 2021-06-06 ENCOUNTER — Other Ambulatory Visit: Payer: Self-pay

## 2021-06-06 VITALS — BP 174/92 | HR 102 | Ht 69.0 in | Wt 209.0 lb

## 2021-06-06 DIAGNOSIS — H269 Unspecified cataract: Secondary | ICD-10-CM | POA: Insufficient documentation

## 2021-06-06 DIAGNOSIS — Z96651 Presence of right artificial knee joint: Secondary | ICD-10-CM | POA: Diagnosis not present

## 2021-06-06 DIAGNOSIS — M6281 Muscle weakness (generalized): Secondary | ICD-10-CM | POA: Diagnosis not present

## 2021-06-06 NOTE — Progress Notes (Signed)
Chief Complaint  Patient presents with   Post-op Follow-up    Right knee 10/12/20   70 year old male status post right total knee complains of stiffness and getting out of bed in the morning and aching when the weather changes  Exam shows he has extensor mechanism weakness with decreased extension actively full extension passively.  Recommend terminal knee extension exercises  Follow-up in 2 months

## 2021-06-06 NOTE — Patient Instructions (Signed)
Knee exercises  

## 2021-07-07 ENCOUNTER — Other Ambulatory Visit: Payer: Self-pay | Admitting: Orthopedic Surgery

## 2021-07-07 DIAGNOSIS — M1710 Unilateral primary osteoarthritis, unspecified knee: Secondary | ICD-10-CM

## 2021-07-07 DIAGNOSIS — M25561 Pain in right knee: Secondary | ICD-10-CM

## 2021-07-08 ENCOUNTER — Telehealth: Payer: Self-pay | Admitting: Radiology

## 2021-07-08 MED ORDER — TRAMADOL HCL 50 MG PO TABS: 50.0000 mg | ORAL_TABLET | Freq: Four times a day (QID) | ORAL | 0 refills | Status: AC | PRN

## 2021-07-08 NOTE — Telephone Encounter (Signed)
Insurance required precertification of the Ultracet I completed the questions on cover my meds, but this will be denied they prefer Tramadol 50 mg  instead  Can you send in Tramadol 50?

## 2021-08-03 ENCOUNTER — Other Ambulatory Visit: Payer: Self-pay | Admitting: Orthopedic Surgery

## 2021-08-03 DIAGNOSIS — Z96651 Presence of right artificial knee joint: Secondary | ICD-10-CM

## 2021-08-03 DIAGNOSIS — M25461 Effusion, right knee: Secondary | ICD-10-CM

## 2021-08-08 ENCOUNTER — Ambulatory Visit: Payer: 59 | Admitting: Orthopedic Surgery

## 2021-10-04 ENCOUNTER — Other Ambulatory Visit (HOSPITAL_BASED_OUTPATIENT_CLINIC_OR_DEPARTMENT_OTHER): Payer: Self-pay

## 2021-10-04 DIAGNOSIS — G4733 Obstructive sleep apnea (adult) (pediatric): Secondary | ICD-10-CM

## 2021-11-18 ENCOUNTER — Ambulatory Visit: Payer: Medicare Other | Attending: Neurology | Admitting: Neurology

## 2021-11-18 DIAGNOSIS — G4733 Obstructive sleep apnea (adult) (pediatric): Secondary | ICD-10-CM | POA: Diagnosis present

## 2021-11-25 NOTE — Procedures (Signed)
?[facility] ?Lothar Prehn A. Gerilyn Pilgrim, MD     [email or web address] ?       ? ?    NOCTURNAL POLYSOMNOGRAPHY  ? ?LOCATION: [facility] ? ? ? ? ?Patient Name: Chris Wade, Chris Wade ?Study Date: 11/18/2021 ?Gender: Male ?D.O.B: Dec 29, 1950 ?Age (years): 12 ?Referring Provider: Arby Barrette MD ?Height (inches): 69 ?Interpreting Physician: Beryle Beams MD, ABSM ?Weight (lbs): 209 ?RPSGT: Peak, Robert ?BMI: 31 ?MRN: [id number] ?Neck Size: 17.00 ?CLINICAL INFORMATION ?Sleep Study Type: NPSG ? ?  ? ?Indication for sleep study: OSA ? ?  ? ?Epworth Sleepiness Score: N/A ? ?  ? ?SLEEP STUDY TECHNIQUE ?As per the AASM Manual for the Scoring of Sleep and Associated Events v2.3 (April 2016) with a hypopnea requiring 4% desaturations. ? ?The channels recorded and monitored were frontal, central and occipital EEG, electrooculogram (EOG), submentalis EMG (chin), nasal and oral airflow, thoracic and abdominal wall motion, anterior tibialis EMG, snore microphone, electrocardiogram, and pulse oximetry. ? ?MEDICATIONS ?Medications self-administered by patient taken the night of the study : N/A ? ?Current Outpatient Medications:  ?  alfuzosin (UROXATRAL) 10 MG 24 hr tablet, Take 10 mg by mouth every evening. , Disp: , Rfl:  ?  amLODipine (NORVASC) 10 MG tablet, Take 10 mg by mouth daily., Disp: , Rfl:  ?  aspirin 81 MG tablet, Take 81 mg by mouth daily., Disp: , Rfl:  ?  atorvastatin (LIPITOR) 80 MG tablet, Take 40 mg by mouth daily., Disp: , Rfl:  ?  celecoxib (CELEBREX) 200 MG capsule, Take 1 capsule (200 mg total) by mouth 2 (two) times daily., Disp: 28 capsule, Rfl: 0 ?  [employer] SENNA PLUS 8.6-50 MG tablet, TAKE 1 TABLET BY MOUTH AT BEDTIME AS NEEDED FOR MILD CONSTIPATION., Disp: 30 tablet, Rfl: 1 ?  finasteride (PROSCAR) 5 MG tablet, Take 5 mg by mouth every evening. , Disp: , Rfl:  ?  ibuprofen (ADVIL) 800 MG tablet, TAKE 1 TABLET BY MOUTH EVERY 8 HOURS AS NEEDED, Disp: 90 tablet, Rfl: 1 ?  JARDIANCE 10 MG TABS tablet, Take 10 mg by mouth  daily., Disp: , Rfl:  ?  LANTUS SOLOSTAR 100 UNIT/ML Solostar Pen, Inject 30 Units into the skin 2 (two) times daily., Disp: , Rfl:  ?  lisinopril (ZESTRIL) 40 MG tablet, Take 40 mg by mouth daily., Disp: , Rfl:  ?  metFORMIN (GLUCOPHAGE) 1000 MG tablet, Take 1,000 mg by mouth 2 (two) times daily with a meal. , Disp: , Rfl:  ?  methocarbamol (ROBAXIN) 500 MG tablet, Take 1 tablet (500 mg total) by mouth every 6 (six) hours as needed for muscle spasms. (Patient not taking: Reported on 06/06/2021), Disp: 28 tablet, Rfl: 3 ? ?  ? ?SLEEP ARCHITECTURE ?The study was initiated at 9:33:52 PM and ended at 5:11:12 AM. ? ?Sleep onset time was 4.2 minutes and the sleep efficiency was 81.2%. The total sleep time was 371.5 minutes. ? ?Stage REM latency was 89.5 minutes. ? ?The patient spent 9.15% of the night in stage N1 sleep, 72.41% in stage N2 sleep, 0.00% in stage N3 and 18.4% in REM. ? ?Alpha intrusion was absent. ? ?Supine sleep was 76.04%. ? ?RESPIRATORY PARAMETERS ?The overall apnea/hypopnea index (AHI) was 15.5 per hour. There were 0 total apneas, including 0 obstructive, 0 central and 0 mixed apneas. There were 96 hypopneas and 0 RERAs. ? ?The AHI during Stage REM sleep was 8.8 per hour. ? ?AHI while supine was 18.9 per hour. ? ?The mean oxygen saturation was 90.76%. The minimum SpO2 during  sleep was 83.00%. ? ?moderate snoring was noted during this study. ? ?CARDIAC DATA ?The 2 lead EKG demonstrated sinus rhythm. The mean heart rate was 93.01 beats per minute. Other EKG findings include: None. ? ?LEG MOVEMENT DATA ?The total PLMS were 0 with a resulting PLMS index of 0.00. Associated arousal with leg movement index was 0.0. ? ?IMPRESSIONS ?Moderate obstructive sleep apnea is noted. AutoPap 8-14 is recommended.  ?Absent slow wave sleep is noted. ? ? ?Argie Ramming, MD ?Diplomate, American Board of Sleep Medicine. ? ?

## 2022-02-27 ENCOUNTER — Other Ambulatory Visit: Payer: Self-pay | Admitting: Orthopedic Surgery

## 2022-02-27 DIAGNOSIS — M1710 Unilateral primary osteoarthritis, unspecified knee: Secondary | ICD-10-CM

## 2022-02-27 DIAGNOSIS — M25561 Pain in right knee: Secondary | ICD-10-CM

## 2022-02-27 MED ORDER — TRAMADOL-ACETAMINOPHEN 37.5-325 MG PO TABS
1.0000 | ORAL_TABLET | Freq: Four times a day (QID) | ORAL | 0 refills | Status: DC | PRN
Start: 2022-02-27 — End: 2022-03-01

## 2022-02-27 NOTE — Progress Notes (Signed)
Meds ordered this encounter  Medications   traMADol-acetaminophen (ULTRACET) 37.5-325 MG tablet    Sig: Take 1 tablet by mouth every 6 (six) hours as needed for severe pain.    Dispense:  28 tablet    Refill:  0    This request is for a new prescription for a controlled substance as required by Federal/State law.

## 2022-03-01 ENCOUNTER — Other Ambulatory Visit: Payer: Self-pay | Admitting: Orthopedic Surgery

## 2022-03-01 DIAGNOSIS — M25561 Pain in right knee: Secondary | ICD-10-CM

## 2022-03-01 DIAGNOSIS — M1710 Unilateral primary osteoarthritis, unspecified knee: Secondary | ICD-10-CM

## 2022-05-17 NOTE — Progress Notes (Signed)
No chief complaint on file.

## 2022-10-16 ENCOUNTER — Other Ambulatory Visit: Payer: Self-pay | Admitting: Orthopedic Surgery

## 2022-10-16 DIAGNOSIS — M1710 Unilateral primary osteoarthritis, unspecified knee: Secondary | ICD-10-CM

## 2022-10-16 DIAGNOSIS — M25561 Pain in right knee: Secondary | ICD-10-CM

## 2023-12-31 ENCOUNTER — Other Ambulatory Visit: Payer: Self-pay | Admitting: Orthopedic Surgery

## 2023-12-31 DIAGNOSIS — M1710 Unilateral primary osteoarthritis, unspecified knee: Secondary | ICD-10-CM

## 2023-12-31 DIAGNOSIS — M25561 Pain in right knee: Secondary | ICD-10-CM
